# Patient Record
Sex: Female | Born: 1935 | Race: Black or African American | Hispanic: No | Marital: Married | Smoking: Former smoker
Health system: Southern US, Community
[De-identification: ages and names within clinical notes are randomized; demographics above are authoritative.]

## PROBLEM LIST (undated history)

## (undated) DIAGNOSIS — I4891 Unspecified atrial fibrillation: Secondary | ICD-10-CM

## (undated) DIAGNOSIS — R42 Dizziness and giddiness: Secondary | ICD-10-CM

## (undated) DIAGNOSIS — I10 Essential (primary) hypertension: Secondary | ICD-10-CM

## (undated) DIAGNOSIS — I4892 Unspecified atrial flutter: Secondary | ICD-10-CM

## (undated) HISTORY — PX: BREAST CYST ASPIRATION: SHX578

## (undated) HISTORY — PX: BREAST CYST EXCISION: SHX579

---

## 2009-01-20 HISTORY — PX: BREAST EXCISIONAL BIOPSY: SUR124

## 2016-03-27 ENCOUNTER — Inpatient Hospital Stay (HOSPITAL_COMMUNITY)
Admission: EM | Admit: 2016-03-27 | Discharge: 2016-04-01 | DRG: 243 | Disposition: A | Discharge status: Home / Self Care | Payer: Medicare HMO | Attending: Cardiovascular Disease | Admitting: Cardiovascular Disease

## 2016-03-27 ENCOUNTER — Emergency Department (HOSPITAL_COMMUNITY): Payer: Medicare HMO

## 2016-03-27 ENCOUNTER — Encounter (HOSPITAL_COMMUNITY): Payer: Self-pay

## 2016-03-27 DIAGNOSIS — E785 Hyperlipidemia, unspecified: Secondary | ICD-10-CM | POA: Diagnosis present

## 2016-03-27 DIAGNOSIS — R0789 Other chest pain: Secondary | ICD-10-CM | POA: Diagnosis present

## 2016-03-27 DIAGNOSIS — I480 Paroxysmal atrial fibrillation: Secondary | ICD-10-CM | POA: Diagnosis present

## 2016-03-27 DIAGNOSIS — Z8249 Family history of ischemic heart disease and other diseases of the circulatory system: Secondary | ICD-10-CM | POA: Diagnosis not present

## 2016-03-27 DIAGNOSIS — I10 Essential (primary) hypertension: Secondary | ICD-10-CM | POA: Diagnosis not present

## 2016-03-27 DIAGNOSIS — R400 Somnolence: Secondary | ICD-10-CM | POA: Diagnosis present

## 2016-03-27 DIAGNOSIS — I484 Atypical atrial flutter: Secondary | ICD-10-CM

## 2016-03-27 DIAGNOSIS — B952 Enterococcus as the cause of diseases classified elsewhere: Secondary | ICD-10-CM | POA: Diagnosis present

## 2016-03-27 DIAGNOSIS — Z7982 Long term (current) use of aspirin: Secondary | ICD-10-CM

## 2016-03-27 DIAGNOSIS — E876 Hypokalemia: Secondary | ICD-10-CM | POA: Diagnosis present

## 2016-03-27 DIAGNOSIS — R55 Syncope and collapse: Secondary | ICD-10-CM

## 2016-03-27 DIAGNOSIS — G4730 Sleep apnea, unspecified: Secondary | ICD-10-CM | POA: Diagnosis present

## 2016-03-27 DIAGNOSIS — Z955 Presence of coronary angioplasty implant and graft: Secondary | ICD-10-CM

## 2016-03-27 DIAGNOSIS — E782 Mixed hyperlipidemia: Secondary | ICD-10-CM | POA: Diagnosis not present

## 2016-03-27 DIAGNOSIS — I25118 Atherosclerotic heart disease of native coronary artery with other forms of angina pectoris: Secondary | ICD-10-CM | POA: Diagnosis present

## 2016-03-27 DIAGNOSIS — I25119 Atherosclerotic heart disease of native coronary artery with unspecified angina pectoris: Secondary | ICD-10-CM | POA: Diagnosis not present

## 2016-03-27 DIAGNOSIS — I495 Sick sinus syndrome: Secondary | ICD-10-CM | POA: Diagnosis present

## 2016-03-27 DIAGNOSIS — N390 Urinary tract infection, site not specified: Secondary | ICD-10-CM | POA: Diagnosis present

## 2016-03-27 DIAGNOSIS — R42 Dizziness and giddiness: Secondary | ICD-10-CM

## 2016-03-27 DIAGNOSIS — Z959 Presence of cardiac and vascular implant and graft, unspecified: Secondary | ICD-10-CM

## 2016-03-27 DIAGNOSIS — I248 Other forms of acute ischemic heart disease: Secondary | ICD-10-CM | POA: Diagnosis present

## 2016-03-27 DIAGNOSIS — R748 Abnormal levels of other serum enzymes: Secondary | ICD-10-CM | POA: Diagnosis not present

## 2016-03-27 DIAGNOSIS — Z79899 Other long term (current) drug therapy: Secondary | ICD-10-CM

## 2016-03-27 DIAGNOSIS — R600 Localized edema: Secondary | ICD-10-CM | POA: Diagnosis present

## 2016-03-27 DIAGNOSIS — I4891 Unspecified atrial fibrillation: Secondary | ICD-10-CM | POA: Diagnosis not present

## 2016-03-27 DIAGNOSIS — I119 Hypertensive heart disease without heart failure: Secondary | ICD-10-CM | POA: Diagnosis present

## 2016-03-27 DIAGNOSIS — I2510 Atherosclerotic heart disease of native coronary artery without angina pectoris: Secondary | ICD-10-CM | POA: Diagnosis not present

## 2016-03-27 DIAGNOSIS — I455 Other specified heart block: Secondary | ICD-10-CM

## 2016-03-27 HISTORY — DX: Essential (primary) hypertension: I10

## 2016-03-27 HISTORY — DX: Dizziness and giddiness: R42

## 2016-03-27 HISTORY — DX: Unspecified atrial flutter: I48.92

## 2016-03-27 HISTORY — DX: Unspecified atrial fibrillation: I48.91

## 2016-03-27 LAB — I-STAT CHEM 8, ED
BUN: 18 mg/dL (ref 6–20)
CHLORIDE: 103 mmol/L (ref 101–111)
Calcium, Ion: 1.28 mmol/L (ref 1.15–1.40)
Creatinine, Ser: 0.9 mg/dL (ref 0.44–1.00)
Glucose, Bld: 197 mg/dL — ABNORMAL HIGH (ref 65–99)
HEMATOCRIT: 43 % (ref 36.0–46.0)
Hemoglobin: 14.6 g/dL (ref 12.0–15.0)
POTASSIUM: 3.5 mmol/L (ref 3.5–5.1)
SODIUM: 141 mmol/L (ref 135–145)
TCO2: 32 mmol/L (ref 0–100)

## 2016-03-27 LAB — COMPREHENSIVE METABOLIC PANEL
ALBUMIN: 4 g/dL (ref 3.5–5.0)
ALK PHOS: 65 U/L (ref 38–126)
ALT: 17 U/L (ref 14–54)
AST: 21 U/L (ref 15–41)
Anion gap: 8 (ref 5–15)
BILIRUBIN TOTAL: 0.6 mg/dL (ref 0.3–1.2)
BUN: 14 mg/dL (ref 6–20)
CALCIUM: 10.3 mg/dL (ref 8.9–10.3)
CO2: 27 mmol/L (ref 22–32)
Chloride: 105 mmol/L (ref 101–111)
Creatinine, Ser: 1.06 mg/dL — ABNORMAL HIGH (ref 0.44–1.00)
GFR calc Af Amer: 57 mL/min — ABNORMAL LOW (ref >60)
GFR calc non Af Amer: 49 mL/min — ABNORMAL LOW (ref >60)
GLUCOSE: 199 mg/dL — AB (ref 65–99)
Potassium: 3.2 mmol/L — ABNORMAL LOW (ref 3.5–5.1)
Sodium: 140 mmol/L (ref 135–145)
TOTAL PROTEIN: 7.3 g/dL (ref 6.5–8.1)

## 2016-03-27 LAB — CBC WITH DIFFERENTIAL/PLATELET
BASOS PCT: 0 %
Basophils Absolute: 0 10*3/uL (ref 0.0–0.1)
EOS ABS: 0.1 10*3/uL (ref 0.0–0.7)
Eosinophils Relative: 1 %
HCT: 42.1 % (ref 36.0–46.0)
HEMOGLOBIN: 13.6 g/dL (ref 12.0–15.0)
LYMPHS ABS: 1.8 10*3/uL (ref 0.7–4.0)
Lymphocytes Relative: 19 %
MCH: 27.3 pg (ref 26.0–34.0)
MCHC: 32.3 g/dL (ref 30.0–36.0)
MCV: 84.4 fL (ref 78.0–100.0)
Monocytes Absolute: 0.8 10*3/uL (ref 0.1–1.0)
Monocytes Relative: 8 %
NEUTROS PCT: 72 %
Neutro Abs: 6.8 10*3/uL (ref 1.7–7.7)
Platelets: 169 10*3/uL (ref 150–400)
RBC: 4.99 MIL/uL (ref 3.87–5.11)
RDW: 13.3 % (ref 11.5–15.5)
WBC: 9.4 10*3/uL (ref 4.0–10.5)

## 2016-03-27 LAB — MAGNESIUM: MAGNESIUM: 2.2 mg/dL (ref 1.7–2.4)

## 2016-03-27 LAB — TROPONIN I: Troponin I: 0.22 ng/mL (ref <0.03)

## 2016-03-27 LAB — BRAIN NATRIURETIC PEPTIDE: B Natriuretic Peptide: 134.7 pg/mL — ABNORMAL HIGH (ref 0.0–100.0)

## 2016-03-27 LAB — I-STAT TROPONIN, ED: Troponin i, poc: 0.01 ng/mL (ref 0.00–0.08)

## 2016-03-27 LAB — I-STAT CG4 LACTIC ACID, ED: Lactic Acid, Venous: 1.78 mmol/L (ref 0.5–1.9)

## 2016-03-27 MED ORDER — HEPARIN BOLUS VIA INFUSION
4000.0000 [IU] | Freq: Once | INTRAVENOUS | Status: AC
  Administered 2016-03-27: 4000 [IU] via INTRAVENOUS
  Filled 2016-03-27: qty 4000

## 2016-03-27 MED ORDER — ACETAMINOPHEN 325 MG PO TABS: 650.0000 mg | ORAL_TABLET | ORAL | Status: DC | PRN

## 2016-03-27 MED ORDER — HEPARIN (PORCINE) IN NACL 100-0.45 UNIT/ML-% IJ SOLN
800.0000 [IU]/h | INTRAMUSCULAR | Status: AC
  Administered 2016-03-27: 950 [IU]/h via INTRAVENOUS
  Administered 2016-03-30: 800 [IU]/h via INTRAVENOUS
  Filled 2016-03-27 (×3): qty 250

## 2016-03-27 MED ORDER — ONDANSETRON HCL 4 MG/2ML IJ SOLN: 4.0000 mg | Freq: Four times a day (QID) | INTRAMUSCULAR | Status: DC | PRN

## 2016-03-27 NOTE — ED Provider Notes (Signed)
[facility]-EMERGENCY DEPT Provider Note   CSN: [id number] Arrival date & time: 03/27/16  1617     History   Chief Complaint Chief Complaint  Patient presents with  . Chest Pain    HPI Janet Everett is a 81 y.o. female.  HPI   81 year old female with history of atrial fibrillation, atrial flutter, coronary artery disease, reportedly on calcium channel blocker but no blood thinners who presents with chest pain and shortness of breath. The patient states that at approximately 3:00 this afternoon, she had acute onset of chest pressure with sensation that she was unable to catch her breath. She also felt like she was going to pass out. EMS was called and they found her in atrial flutter with rate of 140-160. She was given diltiazem which decreased her rate to 82-100 and blood pressure increased to 142/84. She states she continues to feel a dull, aching substernal chest pressure with associated shortness of breath. She denies any recent medication changes. Denies any fevers or chills. Denies any specific alleviating or aggravating factors.  Past Medical History:  Diagnosis Date  . Atrial fibrillation and flutter [facility])     Patient Active Problem List   Diagnosis Date Noted  . Tachy-brady syndrome (HCC) 03/27/2016    History reviewed. No pertinent surgical history.  OB History    No data available       Home Medications    Prior to Admission medications   Medication Sig Start Date End Date Taking? Authorizing Provider  amLODipine (NORVASC) 5 MG tablet Take 5 mg by mouth at bedtime.   Yes Historical Provider, MD  aspirin EC 81 MG tablet Take 81 mg by mouth daily.   Yes Historical Provider, MD  magnesium hydroxide (MILK OF MAGNESIA) 400 MG/5ML suspension Take 30 mLs by mouth daily as needed for mild constipation.   Yes Historical Provider, MD  Multiple Vitamin (MULTIVITAMIN WITH MINERALS) TABS tablet Take 1 tablet by mouth daily.   Yes Historical Provider, MD  nitroGLYCERIN (NITROSTAT)  0.4 MG SL tablet Place 0.4 mg under the tongue every 5 (five) minutes as needed for chest pain.   Yes Historical Provider, MD  ALPRAZolam Prudy Feeler) 0.5 MG tablet Take 0.5 mg by mouth at bedtime as needed for anxiety.    Historical Provider, MD  atorvastatin (LIPITOR) 40 MG tablet Take 40 mg by mouth daily.    Historical Provider, MD  hydrochlorothiazide (HYDRODIURIL) 25 MG tablet Take 25 mg by mouth daily.    Historical Provider, MD  NIFEdipine (PROCARDIA XL/ADALAT-CC) 60 MG 24 hr tablet Take 60 mg by mouth daily as needed (for tachycardia).    Historical Provider, MD  triamterene-hydrochlorothiazide (MAXZIDE-25) 37.5-25 MG tablet Take 1 tablet by mouth daily.    Historical Provider, MD    Family History Family History  Problem Relation Age of Onset  . Hypertension Mother     Social History Social History  Substance Use Topics  . Smoking status: Not on file  . Smokeless tobacco: Not on file  . Alcohol use Not on file     Allergies   Patient has no known allergies.   Review of Systems Review of Systems  Constitutional: Positive for fatigue. Negative for chills and fever.  HENT: Negative for congestion, rhinorrhea and sore throat.   Eyes: Negative for visual disturbance.  Respiratory: Positive for chest tightness and shortness of breath. Negative for cough and wheezing.   Cardiovascular: Positive for chest pain. Negative for leg swelling.  Gastrointestinal: Negative for abdominal pain, diarrhea,  nausea and vomiting.  Genitourinary: Negative for dysuria, flank pain, vaginal bleeding and vaginal discharge.  Musculoskeletal: Negative for neck pain.  Skin: Negative for rash.  Allergic/Immunologic: Negative for immunocompromised state.  Neurological: Positive for weakness and light-headedness. Negative for syncope and headaches.  Hematological: Does not bruise/bleed easily.  All other systems reviewed and are negative.    Physical Exam Updated Vital Signs BP (!) 174/101 (BP  Location: Right Arm)   Pulse (!) 49   Temp 97.7 F (36.5 C) (Oral)   Resp 14   Ht 5\' 2"  (1.575 m)   Wt 152 lb (68.9 kg)   SpO2 100%   BMI 27.80 kg/m   Physical Exam  Constitutional: She is oriented to person, place, and time. She appears well-developed and well-nourished. She appears distressed.  HENT:  Head: Normocephalic and atraumatic.  Eyes: Conjunctivae are normal.  Neck: Neck supple.  Cardiovascular: Exam reveals no friction rub.   No murmur heard. Irregularly irregular rhythm, primarily with tachycardia but intermittently with pauses lasting up to 5 seconds.  Pulmonary/Chest: Effort normal and breath sounds normal. No respiratory distress. She has no wheezes. She has no rales.  Abdominal: She exhibits no distension.  Musculoskeletal: She exhibits no edema.  Neurological: She is alert and oriented to person, place, and time. She exhibits normal muscle tone.  Skin: Skin is warm. Capillary refill takes less than 2 seconds.  Psychiatric: She has a normal mood and affect.  Nursing note and vitals reviewed.    ED Treatments / Results  Labs (all labs ordered are listed, but only abnormal results are displayed) Labs Reviewed  MRSA PCR SCREENING - Abnormal; Notable for the following:       Result Value   MRSA by PCR INVALID RESULTS, SPECIMEN SENT FOR CULTURE (*)    All other components within normal limits  COMPREHENSIVE METABOLIC PANEL - Abnormal; Notable for the following:    Potassium 3.2 (*)    Glucose, Bld 199 (*)    Creatinine, Ser 1.06 (*)    GFR calc non Af Amer 49 (*)    GFR calc Af Amer 57 (*)    All other components within normal limits  BRAIN NATRIURETIC PEPTIDE - Abnormal; Notable for the following:    B Natriuretic Peptide 134.7 (*)    All other components within normal limits  TROPONIN I - Abnormal; Notable for the following:    Troponin I 0.22 (*)    All other components within normal limits  TROPONIN I - Abnormal; Notable for the following:     Troponin I 0.25 (*)    All other components within normal limits  BASIC METABOLIC PANEL - Abnormal; Notable for the following:    Potassium 3.1 (*)    GFR calc non Af Amer 59 (*)    All other components within normal limits  HEPARIN LEVEL (UNFRACTIONATED) - Abnormal; Notable for the following:    Heparin Unfractionated 0.85 (*)    All other components within normal limits  I-STAT CHEM 8, ED - Abnormal; Notable for the following:    Glucose, Bld 197 (*)    All other components within normal limits  MRSA CULTURE  URINE CULTURE  CBC WITH DIFFERENTIAL/PLATELET  MAGNESIUM  CBC  PROTIME-INR  TROPONIN I  HEPARIN LEVEL (UNFRACTIONATED)  URINALYSIS, ROUTINE W REFLEX MICROSCOPIC  LIPID PANEL  I-STAT TROPOININ, ED  I-STAT CG4 LACTIC ACID, ED  Rosezena SensorI-STAT TROPOININ, ED    EKG  EKG Interpretation  Date/Time:  Thursday March 27 2016 16:17:58  EST Ventricular Rate:  96 PR Interval:    QRS Duration: 83 QT Interval:  383 QTC Calculation: 484 R Axis:   16 Text Interpretation:  Atrial flutter with predominant 3:1 AV block Abnormal R-wave progression, early transition LVH with secondary repolarization abnormality No old tracing to compare Confirmed by Ellionna Buckbee MD, Sheria Lang [phone]) on 03/27/2016 4:21:41 PM Also confirmed by Erma Heritage MD, Harden Bramer [phone]), editor [place]  CCT, [place] ([id number])  on 03/28/2016 7:25:39 AM       Radiology Dg Chest Portable 1 View  Result Date: 03/27/2016 CLINICAL DATA:  A-fib with SOB today. Hx of HTN. EXAM: PORTABLE CHEST - 1 VIEW COMPARISON:  none FINDINGS: Monitoring devices overlie the patient. Relatively low lung volumes with no focal infiltrate or overt edema. Heart size upper limits normal for technique. No effusion.  No pneumothorax. Right shoulder arthroplasty. Degenerative change in the left shoulder. IMPRESSION: No acute cardiopulmonary disease. Electronically Signed   By: Corlis Leak M.D.   On: 03/27/2016 16:53    Procedures .Critical Care Performed by: Shaune Pollack Authorized by: Shaune Pollack   Critical care provider statement:    Critical care time (minutes):  55   Critical care time was exclusive of:  Separately billable procedures and treating other patients   Critical care was necessary to treat or prevent imminent or life-threatening deterioration of the following conditions:  Cardiac failure and circulatory failure   Critical care was time spent personally by me on the following activities:  Development of treatment plan with patient or surrogate, discussions with consultants, evaluation of patient's response to treatment, examination of patient, obtaining history from patient or surrogate, ordering and performing treatments and interventions, ordering and review of laboratory studies, ordering and review of radiographic studies, pulse oximetry, re-evaluation of patient's condition and review of old charts   I assumed direction of critical care for this patient from another provider in my specialty: no     (including critical care time)  Medications Ordered in ED Medications  acetaminophen (TYLENOL) tablet 650 mg (not administered)  ondansetron (ZOFRAN) injection 4 mg (not administered)  heparin ADULT infusion 100 units/mL (25000 units/222mL sodium chloride 0.45%) (800 Units/hr Intravenous Rate/Dose Change 03/28/16 0532)  fluconazole (DIFLUCAN) tablet 150 mg (not administered)  amLODipine (NORVASC) tablet 5 mg (not administered)  spironolactone (ALDACTONE) tablet 25 mg (not administered)  atorvastatin (LIPITOR) tablet 40 mg (not administered)  heparin bolus via infusion 4,000 Units (4,000 Units Intravenous Bolus from Bag 03/27/16 2312)     Initial Impression / Assessment and Plan / ED Course  I have reviewed the triage vital signs and the nursing notes.  Pertinent labs & imaging results that were available during my care of the patient were reviewed by me and considered in my medical decision making (see chart for details).      81 year old female with history of coronary artery disease and atrial flutter who presents in acute atrial flutter, now complicated by prolonged pauses up to 4-5 seconds. She has received diltiazem and given her sinus pauses, and hesitant to give additional beta-blockade. She does have diffuse evidence of ischemia as well, though no overt ST elevations. Given her complicated arrhythmia, will consult cardiology for recommendations. Otherwise, will check screening labs and reassess.  Cardiology has evaluated. Pt has complex arrhythmia and will be admitted to CCU for PM placement followed by rhythm control. Her labs are o/w largely unremarkable. Lytes acceptable. She continues to have pauses but is able to regain perfusion thereafter. Pt on pads and monitored  closely in ED. Admit to CCU.  Final Clinical Impressions(s) / ED Diagnoses   Final diagnoses:  Atypical atrial flutter (HCC)  Sinus pause  Near syncope    New Prescriptions Current Discharge Medication List       Shaune Pollack, MD 03/28/16 1050

## 2016-03-27 NOTE — ED Triage Notes (Signed)
Patient transported via [facility]. EMS reports the patient had an onset of chest pain while at a restaurant at approx 15:00. She took 2 of her own nitro without relief. Upon arrival EMS found her diaphoretic with a BP of 106/70. Her initial rhythm was A-Fib and EMS administered 10 of Cardizem. Her rate increased to 140-160 bpm and they administered another 10mg  of Cardizem. Her pulse decreased to 80-100bpm and her BP increased to 142/84.

## 2016-03-27 NOTE — Consult Note (Signed)
CONSULT NOTE  Date: 03/27/2016               Patient Name:  Janet Everett MRN: [id number]  DOB: [birth year] Age / Sex: 81 y.o., female        PCP: No PCP Per Patient Primary Cardiologist: Ellis Parents Steffon Gladu            Referring Physician: Erma Heritage              Reason for Consult: Tachy brady syndrom            History of Present Illness: Patient is a 81 y.o. female with a PMHx of Coronary artery disease, HTN And paroxysmal A. fib, who was admitted to [facility] on 03/27/2016 for evaluation of episodes of near syncope.  Patient moved from [place] in October. She knows that she has a history of coronary artery disease and has had 2 stents. She does not know any of her medications. She does not think that she is on a blood thinner. She has her the name Pradaxa but that is all she can tell me.  She also thinks that she is on amlodipine for her high blood pressure.  She's had episodes of palpitations for the past month or so. For the past several weeks she's had intermittent episodes of lightheadedness and dizziness. These episodes occur suddenly.  They're not associated with any exertion.  She also has been having some episodes of chest tightness and burning in her chest. These are similar to her presentation when she had the 2 stents placed..   She denies any cough or sputum production. She denies any fever. She denies any blood in her stool. She denies any easy bruising.   Medications: Outpatient medications:  (Not in a hospital admission)  Current medications: No current facility-administered medications for this encounter.    No current outpatient prescriptions on file.     Allergies not on file   Past Medical History:  Diagnosis Date  . Atrial fibrillation and flutter (HCC)     History reviewed. No pertinent surgical history.  Family History  Problem Relation Age of Onset  . Hypertension Mother     Social History:  has no tobacco, alcohol, and drug history on  file.   Review of Systems: Constitutional:  denies fever, chills, diaphoresis, appetite change and fatigue.  HEENT: denies photophobia, eye pain, redness, hearing loss, ear pain, congestion, sore throat, rhinorrhea, sneezing, neck pain, neck stiffness and tinnitus.  Respiratory: denies SOB, DOE, cough, chest tightness, and wheezing.  Cardiovascular: admits to chest pain,  Gastrointestinal: denies nausea, vomiting, abdominal pain, diarrhea, constipation, blood in stool.  Genitourinary: denies dysuria, urgency, frequency, hematuria, flank pain and difficulty urinating.  Musculoskeletal: denies  myalgias, back pain, joint swelling, arthralgias and gait problem.   Skin: denies pallor, rash and wound.  Neurological: admits to dizziness,  syncope, weakness, light-headedness, numbness    Hematological: denies adenopathy, easy bruising, personal or family bleeding history.  Psychiatric/ Behavioral: denies suicidal ideation, mood changes, confusion, nervousness, sleep disturbance and agitation.    Physical Exam: BP 147/88   Pulse (!) 131   Resp 20   Wt 155 lb (70.3 kg)   SpO2 100%   Wt Readings from Last 3 Encounters:  03/27/16 155 lb (70.3 kg)    General: Vital signs reviewed and noted. Chronically ill-appearing elderly female   Head: Normocephalic, atraumatic, sclera anicteric,   Neck: Supple. Negative for carotid bruits. No JVD   Lungs:  Clear bilaterally,  no  wheezes, rales, or rhonchi. Breathing is normal   Heart: irreg .  Irreg.   with S1 S2. No murmurs, rubs, or gallops   Abdomen/ GI :  Soft, non-tender, non-distended with normoactive bowel sounds. No hepatomegaly. No rebound/guarding. No obvious abdominal masses   MSK: Strength and the appear normal for age.   Extremities: No clubbing or cyanosis. No edema.  Distal pedal pulses are 2+ and equal   Neurologic:  CN are grossly intact,  No obvious motor or sensory defect.  Alert and oriented X 3. Moves all extremities spontaneously.   Psych: Responds to questions appropriately with a normal affect.     Lab results: Basic Metabolic Panel:  Recent Labs Lab 03/27/16 1640 03/27/16 1650  NA 140 141  K 3.2* 3.5  CL 105 103  CO2 27  --   GLUCOSE 199* 197*  BUN 14 18  CREATININE 1.06* 0.90  CALCIUM 10.3  --   MG 2.2  --     Liver Function Tests:  Recent Labs Lab 03/27/16 1640  AST 21  ALT 17  ALKPHOS 65  BILITOT 0.6  PROT 7.3  ALBUMIN 4.0   No results for input(s): LIPASE, AMYLASE in the last 168 hours. No results for input(s): AMMONIA in the last 168 hours.  CBC:  Recent Labs Lab 03/27/16 1640 03/27/16 1650  WBC 9.4  --   NEUTROABS 6.8  --   HGB 13.6 14.6  HCT 42.1 43.0  MCV 84.4  --   PLT 169  --     Cardiac Enzymes: No results for input(s): CKTOTAL, CKMB, CKMBINDEX, TROPONINI in the last 168 hours.  BNP: Invalid input(s): POCBNP  CBG: No results for input(s): GLUCAP in the last 168 hours.  Coagulation Studies: No results for input(s): LABPROT, INR in the last 72 hours.   Other results:  Personal review of EKG shows :  intermittant atrial fib at rate of  96 Personal review of telemetry reveals rapid atrial fibrillation followed by pauses of 3-4 seconds and normal sinus rhythm. These appear to be post-termination pauses.  Imaging: Dg Chest Portable 1 View  Result Date: 03/27/2016 CLINICAL DATA:  A-fib with SOB today. Hx of HTN. EXAM: PORTABLE CHEST - 1 VIEW COMPARISON:  none FINDINGS: Monitoring devices overlie the patient. Relatively low lung volumes with no focal infiltrate or overt edema. Heart size upper limits normal for technique. No effusion.  No pneumothorax. Right shoulder arthroplasty. Degenerative change in the left shoulder. IMPRESSION: No acute cardiopulmonary disease. Electronically Signed   By: Corlis Leak M.D.   On: 03/27/2016 16:53      Assessment & Plan:  1. Near-syncope: These appear to be due to her tachybradycardia syndrome with significant pauses. She  does not know any of her medications. She does know that she has atrial fibrillation but she does not think that she is on a blood thinner. Her family will go get her medicine bottles tonight.  I named all of the standard anticoagulation medications and she did not recognize any of them except for Pradaxa and she knows that she is not on Pradaxa.  We will place her on IV heparin for tonight.   2. Tachybradycardia syndrome: She clearly has episodes of tachybradycardia syndrome. She has rapid atrial fibrillation followed by 3-4 second pauses and the sensation of near syncope. I suspect that she will need a pacemaker placed. She has never been told this before. We will place the external temporary pacer at the bedside. I do  not think that she'll need a temporary pacing wire tonight. Her rate is a little bit fast but it will not be possible to add any AV nodal blocking  agents tonight because of the intermittent pauses. She's been in this rhythm for probably 3 weeks according to her history.   She's not having perfusion issues. Her creatinine remains normal.  Will need an echocardiogram once we get her heart rate slowed. I do not think that an echocardiogram when she is in rapid atrial fibrillation will necessarily give us the best indication of her left ventricle systolic function  3. Coronary artery disease: She has a history of coronary stenting. Her history is somewhat vague but it sounds like she might be having some episodes of her angina or angina equivalent. We'll draw troponins. Unfortunately, it is not possible to slow her heart rate down with AV nodal agents because of her episodes of significant pauses.  She may ultimately need a  left heart cath.      Vesta MixerPhilip J. Imelda Dandridge, Montez HagemanJr., MD, [facility] 03/27/2016, 6:05 PM Office - [phone] Pager [phone]- [phone]

## 2016-03-27 NOTE — ED Notes (Signed)
EDP notified of patient's variable heart rate.  Rate is currently fluctuating between 60-140 bpm. Patient has had several pauses.

## 2016-03-27 NOTE — Progress Notes (Signed)
ANTICOAGULATION CONSULT NOTE - Initial Consult  Pharmacy Consult for Heparin  Indication: atrial fibrillation  No Known Allergies  Patient Measurements: Height: 5\' 2"  (157.5 cm) Weight: 152 lb (68.9 kg) IBW/kg (Calculated) : 50.1 Heparin Dosing Weight: 68.9 kg  Vital Signs: Temp: 98 F (36.7 C) (03/08 2134) BP: 165/70 (03/08 2015) Pulse Rate: 54 (03/08 2134)  Labs:  Recent Labs  03/27/16 1640 03/27/16 1650  HGB 13.6 14.6  HCT 42.1 43.0  PLT 169  --   CREATININE 1.06* 0.90    Estimated Creatinine Clearance: 46.8 mL/min (by C-G formula based on SCr of 0.9 mg/dL).   Medical History: Past Medical History:  Diagnosis Date  . Atrial fibrillation and flutter [facility](HCC)    Assessment:   81 yr old female admitted with near syncope and tachy-brady syndrome. May need pacemaker.   To begin IV heparin for atrial fibrillation. Hx PAF.  On Aspirin 81 mg daily prior to admission, but not on anticoagulant.  Goal of Therapy:  Heparin level 0.3-0.7 units/ml Monitor platelets by anticoagulation protocol: Yes   Plan:   Heparin 4000 units IV x 1  Heparin drip to begin at 950 units/hr.  Heparin level ~6 hrs after drip begins.  Daily heparin level and CBC while on heparin.  Dennie FettersEgan, Skylinn Vialpando Donovan, RPh Pager: [phone] 03/27/2016,10:46 PM

## 2016-03-27 NOTE — ED Notes (Signed)
Patient is diaphoretic and reports "feeling strange". Patient placed on 3L O2 due to HR and distress.

## 2016-03-27 NOTE — H&P (Signed)
Admission Note   Date: 03/27/2016               Patient Name:  Janet Everett MRN: [id number]  DOB: Jun 17, 1935 Age / Sex: 81 y.o., female        PCP: No PCP Per Patient Primary Cardiologist: Ellis Parents Nahser            Referring Physician: Erma Heritage              Reason for Consult: Tachy brady syndrom            History of Present Illness: Patient is a 81 y.o. female with a PMHx of Coronary artery disease, HTN And paroxysmal A. fib, who was admitted to [facility] on 03/27/2016 for evaluation of episodes of near syncope.  Patient moved from [place] in October. She knows that she has a history of coronary artery disease and has had 2 stents. She does not know any of her medications. She does not think that she is on a blood thinner. She has her the name Pradaxa but that is all she can tell me.  She also thinks that she is on amlodipine for her high blood pressure.  She's had episodes of palpitations for the past month or so. For the past several weeks she's had intermittent episodes of lightheadedness and dizziness. These episodes occur suddenly.  They're not associated with any exertion.  She also has been having some episodes of chest tightness and burning in her chest. These are similar to her presentation when she had the 2 stents placed..   She denies any cough or sputum production. She denies any fever. She denies any blood in her stool. She denies any easy bruising.   Medications: Outpatient medications:  (Not in a hospital admission)  Current medications: No current facility-administered medications for this encounter.    No current outpatient prescriptions on file.     Allergies not on file   Past Medical History:  Diagnosis Date  . Atrial fibrillation and flutter (HCC)     History reviewed. No pertinent surgical history.  Family History  Problem Relation Age of Onset  . Hypertension Mother     Social History:  has no tobacco, alcohol, and drug history on  file.   Review of Systems: Constitutional:  denies fever, chills, diaphoresis, appetite change and fatigue.  HEENT: denies photophobia, eye pain, redness, hearing loss, ear pain, congestion, sore throat, rhinorrhea, sneezing, neck pain, neck stiffness and tinnitus.  Respiratory: denies SOB, DOE, cough, chest tightness, and wheezing.  Cardiovascular: admits to chest pain,  Gastrointestinal: denies nausea, vomiting, abdominal pain, diarrhea, constipation, blood in stool.  Genitourinary: denies dysuria, urgency, frequency, hematuria, flank pain and difficulty urinating.  Musculoskeletal: denies  myalgias, back pain, joint swelling, arthralgias and gait problem.   Skin: denies pallor, rash and wound.  Neurological: admits to dizziness,  syncope, weakness, light-headedness, numbness    Hematological: denies adenopathy, easy bruising, personal or family bleeding history.  Psychiatric/ Behavioral: denies suicidal ideation, mood changes, confusion, nervousness, sleep disturbance and agitation.    Physical Exam: BP 147/88   Pulse (!) 131   Resp 20   Wt 155 lb (70.3 kg)   SpO2 100%   Wt Readings from Last 3 Encounters:  03/27/16 155 lb (70.3 kg)    General: Vital signs reviewed and noted. Chronically ill-appearing elderly female   Head: Normocephalic, atraumatic, sclera anicteric,   Neck: Supple. Negative for carotid bruits. No JVD   Lungs:  Clear  bilaterally, no  wheezes, rales, or rhonchi. Breathing is normal   Heart: irreg .  Irreg.   with S1 S2. No murmurs, rubs, or gallops   Abdomen/ GI :  Soft, non-tender, non-distended with normoactive bowel sounds. No hepatomegaly. No rebound/guarding. No obvious abdominal masses   MSK: Strength and the appear normal for age.   Extremities: No clubbing or cyanosis. No edema.  Distal pedal pulses are 2+ and equal   Neurologic:  CN are grossly intact,  No obvious motor or sensory defect.  Alert and oriented X 3. Moves all extremities spontaneously.   Psych: Responds to questions appropriately with a normal affect.     Lab results: Basic Metabolic Panel:  Recent Labs Lab 03/27/16 1640 03/27/16 1650  NA 140 141  K 3.2* 3.5  CL 105 103  CO2 27  --   GLUCOSE 199* 197*  BUN 14 18  CREATININE 1.06* 0.90  CALCIUM 10.3  --   MG 2.2  --     Liver Function Tests:  Recent Labs Lab 03/27/16 1640  AST 21  ALT 17  ALKPHOS 65  BILITOT 0.6  PROT 7.3  ALBUMIN 4.0   No results for input(s): LIPASE, AMYLASE in the last 168 hours. No results for input(s): AMMONIA in the last 168 hours.  CBC:  Recent Labs Lab 03/27/16 1640 03/27/16 1650  WBC 9.4  --   NEUTROABS 6.8  --   HGB 13.6 14.6  HCT 42.1 43.0  MCV 84.4  --   PLT 169  --     Cardiac Enzymes: No results for input(s): CKTOTAL, CKMB, CKMBINDEX, TROPONINI in the last 168 hours.  BNP: Invalid input(s): POCBNP  CBG: No results for input(s): GLUCAP in the last 168 hours.  Coagulation Studies: No results for input(s): LABPROT, INR in the last 72 hours.   Other results:  Personal review of EKG shows :  intermittant atrial fib at rate of  96 Personal review of telemetry reveals rapid atrial fibrillation followed by pauses of 3-4 seconds and normal sinus rhythm. These appear to be post-termination pauses.  Imaging: Dg Chest Portable 1 View  Result Date: 03/27/2016 CLINICAL DATA:  A-fib with SOB today. Hx of HTN. EXAM: PORTABLE CHEST - 1 VIEW COMPARISON:  none FINDINGS: Monitoring devices overlie the patient. Relatively low lung volumes with no focal infiltrate or overt edema. Heart size upper limits normal for technique. No effusion.  No pneumothorax. Right shoulder arthroplasty. Degenerative change in the left shoulder. IMPRESSION: No acute cardiopulmonary disease. Electronically Signed   By: Corlis Leak  Hassell M.D.   On: 03/27/2016 16:53      Assessment & Plan:  1. Near-syncope: These appear to be due to her tachybradycardia syndrome with significant pauses. She  does not know any of her medications. She does know that she has atrial fibrillation but she does not think that she is on a blood thinner. Her family will go get her medicine bottles tonight.  I named all of the standard anticoagulation medications and she did not recognize any of them except for Pradaxa and she knows that she is not on Pradaxa.  We will place her on IV heparin for tonight.   2. Tachybradycardia syndrome: She clearly has episodes of tachybradycardia syndrome. She has rapid atrial fibrillation followed by 3-4 second pauses and the sensation of near syncope. I suspect that she will need a pacemaker placed. She has never been told this before. We will place the external temporary pacer at the bedside. I  do not think that she'll need a temporary pacing wire tonight. Her rate is a little bit fast but it will not be possible to add any AV nodal blocking  agents tonight because of the intermittent pauses. She's been in this rhythm for probably 3 weeks according to her history.   She's not having perfusion issues. Her creatinine remains normal.  Will need an echocardiogram once we get her heart rate slowed. I do not think that an echocardiogram when she is in rapid atrial fibrillation will necessarily give [place] the best indication of her left ventricle systolic function  3. Coronary artery disease: She has a history of coronary stenting. Her history is somewhat vague but it sounds like she might be having some episodes of her angina or angina equivalent. We'll draw troponins. Unfortunately, it is not possible to slow her heart rate down with AV nodal agents because of her episodes of significant pauses.  She may ultimately need a  left heart cath.      Vesta Mixer, Montez Hageman., MD, [facility] 03/27/2016, 6:05 PM Office - [phone] Pager [phone]

## 2016-03-28 ENCOUNTER — Encounter (HOSPITAL_COMMUNITY): Payer: Self-pay

## 2016-03-28 DIAGNOSIS — I10 Essential (primary) hypertension: Secondary | ICD-10-CM

## 2016-03-28 DIAGNOSIS — I2510 Atherosclerotic heart disease of native coronary artery without angina pectoris: Secondary | ICD-10-CM

## 2016-03-28 DIAGNOSIS — E782 Mixed hyperlipidemia: Secondary | ICD-10-CM

## 2016-03-28 LAB — BASIC METABOLIC PANEL
ANION GAP: 8 (ref 5–15)
BUN: 12 mg/dL (ref 6–20)
CO2: 25 mmol/L (ref 22–32)
Calcium: 9.8 mg/dL (ref 8.9–10.3)
Chloride: 108 mmol/L (ref 101–111)
Creatinine, Ser: 0.91 mg/dL (ref 0.44–1.00)
GFR, EST NON AFRICAN AMERICAN: 59 mL/min — AB (ref >60)
Glucose, Bld: 92 mg/dL (ref 65–99)
Potassium: 3.1 mmol/L — ABNORMAL LOW (ref 3.5–5.1)
SODIUM: 141 mmol/L (ref 135–145)

## 2016-03-28 LAB — URINALYSIS, ROUTINE W REFLEX MICROSCOPIC
Bilirubin Urine: NEGATIVE
GLUCOSE, UA: NEGATIVE mg/dL
HGB URINE DIPSTICK: NEGATIVE
Ketones, ur: NEGATIVE mg/dL
NITRITE: NEGATIVE
Protein, ur: NEGATIVE mg/dL
SPECIFIC GRAVITY, URINE: 1.014 (ref 1.005–1.030)
pH: 7 (ref 5.0–8.0)

## 2016-03-28 LAB — PROTIME-INR
INR: 1.13
Prothrombin Time: 14.6 seconds (ref 11.4–15.2)

## 2016-03-28 LAB — CBC
HCT: 38.3 % (ref 36.0–46.0)
Hemoglobin: 12.5 g/dL (ref 12.0–15.0)
MCH: 27.4 pg (ref 26.0–34.0)
MCHC: 32.6 g/dL (ref 30.0–36.0)
MCV: 84 fL (ref 78.0–100.0)
Platelets: 161 10*3/uL (ref 150–400)
RBC: 4.56 MIL/uL (ref 3.87–5.11)
RDW: 13.3 % (ref 11.5–15.5)
WBC: 7 10*3/uL (ref 4.0–10.5)

## 2016-03-28 LAB — LIPID PANEL
CHOL/HDL RATIO: 4 ratio
CHOLESTEROL: 231 mg/dL — AB (ref 0–200)
HDL: 58 mg/dL (ref >40)
LDL CALC: 162 mg/dL — AB (ref 0–99)
Triglycerides: 54 mg/dL (ref <150)
VLDL: 11 mg/dL (ref 0–40)

## 2016-03-28 LAB — MRSA PCR SCREENING: MRSA by PCR: INVALID — AB

## 2016-03-28 LAB — TROPONIN I
TROPONIN I: 0.12 ng/mL — AB (ref <0.03)
Troponin I: 0.25 ng/mL (ref <0.03)

## 2016-03-28 LAB — HEPARIN LEVEL (UNFRACTIONATED)
HEPARIN UNFRACTIONATED: 0.85 [IU]/mL — AB (ref 0.30–0.70)
HEPARIN UNFRACTIONATED: 0.43 [IU]/mL (ref 0.30–0.70)
Heparin Unfractionated: 0.53 IU/mL (ref 0.30–0.70)

## 2016-03-28 MED ORDER — SENNA 8.6 MG PO TABS
1.0000 | ORAL_TABLET | Freq: Every day | ORAL | Status: DC | PRN
  Administered 2016-03-28: 8.6 mg via ORAL
  Filled 2016-03-28: qty 1

## 2016-03-28 MED ORDER — ATORVASTATIN CALCIUM 40 MG PO TABS
40.0000 mg | ORAL_TABLET | Freq: Every day | ORAL | Status: DC
  Administered 2016-03-28 – 2016-03-31 (×4): 40 mg via ORAL
  Filled 2016-03-28 (×4): qty 1

## 2016-03-28 MED ORDER — SPIRONOLACTONE 25 MG PO TABS
25.0000 mg | ORAL_TABLET | Freq: Every day | ORAL | Status: DC
  Administered 2016-03-28 – 2016-04-01 (×5): 25 mg via ORAL
  Filled 2016-03-28 (×5): qty 1

## 2016-03-28 MED ORDER — AMLODIPINE BESYLATE 5 MG PO TABS
5.0000 mg | ORAL_TABLET | Freq: Every day | ORAL | Status: DC
  Administered 2016-03-28 – 2016-03-30 (×3): 5 mg via ORAL
  Filled 2016-03-28 (×3): qty 1

## 2016-03-28 MED ORDER — FLUCONAZOLE 150 MG PO TABS
150.0000 mg | ORAL_TABLET | Freq: Once | ORAL | Status: AC
  Administered 2016-03-28: 150 mg via ORAL
  Filled 2016-03-28: qty 1

## 2016-03-28 NOTE — Progress Notes (Signed)
Progress Note  Patient Name: Janet Everett Date of Encounter: 03/28/2016  Primary Cardiologist: new, Nahser  Subjective   81 yo female, hx of HTN,  Presented on 3/8 with pre-syncope Was found to have tachy- brady with rapid AF in the 120-140 followed by pauses of 3 seconds and sinus bradycardia for the next 30 seconds or so  Feeling much better  Rhythm has calmed down significantly .   Inpatient Medications    Scheduled Meds:  Continuous Infusions: . heparin 800 Units/hr (03/28/16 0532)   PRN Meds: acetaminophen, ondansetron (ZOFRAN) IV   Vital Signs    Vitals:   03/28/16 0556 03/28/16 0700 03/28/16 0800 03/28/16 0832  BP: (!) 167/96 (!) 189/86 (!) 174/101   Pulse:  (!) 50 (!) 49   Resp:  16 14   Temp:    97.7 F (36.5 C)  TempSrc:    Oral  SpO2: 98% 100% 100%   Weight:      Height:        Intake/Output Summary (Last 24 hours) at 03/28/16 1014 Last data filed at 03/28/16 0800  Gross per 24 hour  Intake            80.06 ml  Output              500 ml  Net          -419.94 ml   Filed Weights   03/27/16 1638 03/27/16 2134  Weight: 155 lb (70.3 kg) 152 lb (68.9 kg)    Telemetry    Sinus brady , NSR  - Personally Reviewed  ECG       Physical Exam   BP (!) 174/101 (BP Location: Right Arm)   Pulse (!) 49   Temp 97.7 F (36.5 C) (Oral)   Resp 14   Ht 5\' 2"  (1.575 m)   Wt 152 lb (68.9 kg)   SpO2 100%   BMI 27.80 kg/m   GEN: No acute distress.   Neck: No JVD Cardiac: RRR, no murmurs, rubs, or gallops.  Respiratory: Clear to auscultation bilaterally. GI: Soft, nontender, non-distended  MS: No edema; No deformity. Neuro:  Nonfocal  Psych: Normal affect   Labs    Chemistry Recent Labs Lab 03/27/16 1640 03/27/16 1650 03/28/16 0425  NA 140 141 141  K 3.2* 3.5 3.1*  CL 105 103 108  CO2 27  --  25  GLUCOSE 199* 197* 92  BUN 14 18 12   CREATININE 1.06* 0.90 0.91  CALCIUM 10.3  --  9.8  PROT 7.3  --   --   ALBUMIN 4.0  --   --   AST  21  --   --   ALT 17  --   --   ALKPHOS 65  --   --   BILITOT 0.6  --   --   GFRNONAA 49*  --  59*  GFRAA 57*  --  >60  ANIONGAP 8  --  8     Hematology Recent Labs Lab 03/27/16 1640 03/27/16 1650 03/28/16 0425  WBC 9.4  --  7.0  RBC 4.99  --  4.56  HGB 13.6 14.6 12.5  HCT 42.1 43.0 38.3  MCV 84.4  --  84.0  MCH 27.3  --  27.4  MCHC 32.3  --  32.6  RDW 13.3  --  13.3  PLT 169  --  161    Cardiac Enzymes Recent Labs Lab 03/27/16 2240 03/28/16 0425  TROPONINI 0.22* 0.25*  Recent Labs Lab 03/27/16 1648  TROPIPOC 0.01     BNP Recent Labs Lab 03/27/16 1624  BNP 134.7*     DDimer No results for input(s): DDIMER in the last 168 hours.   Radiology    Dg Chest Portable 1 View  Result Date: 03/27/2016 CLINICAL DATA:  A-fib with SOB today. Hx of HTN. EXAM: PORTABLE CHEST - 1 VIEW COMPARISON:  none FINDINGS: Monitoring devices overlie the patient. Relatively low lung volumes with no focal infiltrate or overt edema. Heart size upper limits normal for technique. No effusion.  No pneumothorax. Right shoulder arthroplasty. Degenerative change in the left shoulder. IMPRESSION: No acute cardiopulmonary disease. Electronically Signed   By: Corlis Leak  Hassell M.D.   On: 03/27/2016 16:53    Cardiac Studies      Patient Profile     81 y.o. female  With HTN, tachy - brady syndrome   Assessment & Plan    1. Tachy - brady syndrome.    Rhythm has stabilized quite a bit. CHADS2VASC is at least 734( age 81, HTN,  CAD )   Will need anticoagulation at the time of DC Will continue heparin for now   2. CAD :   Has hx of coronary stenting .    Does not have angnia at present.  + troponins.   Could be demand ischemia. Will get a [employer] myoview tomorrow    3. HTN   - family brought in meds.  Have restarted amlodipine.  Will start aldactone instead of HCTZ   4. Hyperlipidemia:   Continue atrova 40 , check labs today     Signed, Kristeen MissPhilip Nahser, MD  03/28/2016, 10:14 AM

## 2016-03-28 NOTE — Progress Notes (Signed)
ANTICOAGULATION CONSULT NOTE - Follow-up Consult  Pharmacy Consult for Heparin  Indication: atrial fibrillation  No Known Allergies  Patient Measurements: Height: 5\' 2"  (157.5 cm) Weight: 152 lb (68.9 kg) IBW/kg (Calculated) : 50.1 Heparin Dosing Weight: 68.9 kg  Vital Signs: Temp: 98.3 F (36.8 C) (03/09 0400) Temp Source: Oral (03/09 0400) BP: 138/83 (03/09 0300) Pulse Rate: 51 (03/09 0300)  Labs:  Recent Labs  03/27/16 1640 03/27/16 1650 03/27/16 2240 03/28/16 0425  HGB 13.6 14.6  --  12.5  HCT 42.1 43.0  --  38.3  PLT 169  --   --  161  LABPROT  --   --   --  14.6  INR  --   --   --  1.13  HEPARINUNFRC  --   --   --  0.85*  CREATININE 1.06* 0.90  --   --   TROPONINI  --   --  0.22*  --     Estimated Creatinine Clearance: 46.8 mL/min (by C-G formula based on SCr of 0.9 mg/dL).  Assessment: 81 yr old female admitted with near syncope and tachy-brady syndrome. May need pacemaker. Pt on IV heparin for atrial fibrillation. Heparin level supratherapeutic (0.85) on gtt at 950 units/hr. Hgb down to 12.5, no bleeding noted.  Goal of Therapy:  Heparin level 0.3-0.7 units/ml Monitor platelets by anticoagulation protocol: Yes   Plan:  Decrease heparin drip to 800 units/hr. F/u 8 hr heparin level   Christoper Fabianaron Domique Reardon, PharmD, BCPS Clinical pharmacist, pager [phone] 03/28/2016,5:28 AM

## 2016-03-28 NOTE — Progress Notes (Signed)
ANTICOAGULATION CONSULT NOTE - Follow-up Consult  Pharmacy Consult for Heparin  Indication: atrial fibrillation  No Known Allergies  Patient Measurements: Height: 5\' 2"  (157.5 cm) Weight: 152 lb (68.9 kg) IBW/kg (Calculated) : 50.1 Heparin Dosing Weight: 68.9 kg  Vital Signs: Temp: 97.3 F (36.3 C) (03/09 1118) Temp Source: Oral (03/09 1118) BP: 161/80 (03/09 1100) Pulse Rate: 48 (03/09 1100)  Labs:  Recent Labs  03/27/16 1640 03/27/16 1650 03/27/16 2240 03/28/16 0425 03/28/16 1111  HGB 13.6 14.6  --  12.5  --   HCT 42.1 43.0  --  38.3  --   PLT 169  --   --  161  --   LABPROT  --   --   --  14.6  --   INR  --   --   --  1.13  --   HEPARINUNFRC  --   --   --  0.85* 0.53  CREATININE 1.06* 0.90  --  0.91  --   TROPONINI  --   --  0.22* 0.25* 0.12*    Estimated Creatinine Clearance: 46.3 mL/min (by C-G formula based on SCr of 0.91 mg/dL).  Assessment: 81 yr old female admitted with near syncope and tachy-brady syndrome. May need pacemaker. Pt on IV heparin for atrial fibrillation. Heparin level is therapeutic at 0.53 on recently reduced rate. Last Hgb 12.5, stable platelets. No bleeding noted per patient. Continue current rate and check confirmatory.  Goal of Therapy:  Heparin level 0.3-0.7 units/ml Monitor platelets by anticoagulation protocol: Yes   Plan:  Continue heparin drip at 800 units/hr.  Check daily heparin level and CBC Check 6 hr heparin level  Monitor signs and symptoms of bleeding  Chales AbrahamsJuwon Gola Bribiesca, PharmD Candidate 03/28/2016,1:00 PM

## 2016-03-28 NOTE — Progress Notes (Signed)
ANTICOAGULATION CONSULT NOTE - Follow-up Consult  Pharmacy Consult for Heparin  Indication: atrial fibrillation  No Known Allergies  Patient Measurements: Height:  (157.5 cm) Weight: 152 lb (68.9 kg) IBW/kg (Calculated) : 50.1 Heparin Dosing Weight: 68.9 kg  Vital Signs: Temp: 98.2 F (36.8 C) (03/09 1732) Temp Source: Oral (03/09 1732) BP: 111/86 (03/09 1800) Pulse Rate: 59 (03/09 1800)  Assessment: 81 yr old female admitted with near syncope and tachy-brady syndrome. May need pacemaker. Pt on IV heparin for atrial fibrillation. Heparin level remains therapeutic at 0.43.  Goal of Therapy:  Heparin level 0.3-0.7 units/ml Monitor platelets by anticoagulation protocol: Yes   Plan:  Continue heparin drip at 800 units/hr Check daily heparin level and CBC Monitor signs and symptoms of bleeding  Enzo Bi, PharmD, BCPS Clinical Pharmacist Pager [phone] 03/28/2016 7:31 PM

## 2016-03-29 ENCOUNTER — Encounter (HOSPITAL_COMMUNITY): Payer: Self-pay | Admitting: *Deleted

## 2016-03-29 DIAGNOSIS — I480 Paroxysmal atrial fibrillation: Secondary | ICD-10-CM

## 2016-03-29 DIAGNOSIS — R748 Abnormal levels of other serum enzymes: Secondary | ICD-10-CM

## 2016-03-29 DIAGNOSIS — I25119 Atherosclerotic heart disease of native coronary artery with unspecified angina pectoris: Secondary | ICD-10-CM

## 2016-03-29 LAB — MRSA CULTURE: Culture: NOT DETECTED

## 2016-03-29 LAB — CBC
HEMATOCRIT: 40.5 % (ref 36.0–46.0)
HEMOGLOBIN: 13 g/dL (ref 12.0–15.0)
MCH: 27.1 pg (ref 26.0–34.0)
MCHC: 32.1 g/dL (ref 30.0–36.0)
MCV: 84.6 fL (ref 78.0–100.0)
Platelets: 158 10*3/uL (ref 150–400)
RBC: 4.79 MIL/uL (ref 3.87–5.11)
RDW: 13.3 % (ref 11.5–15.5)
WBC: 7.2 10*3/uL (ref 4.0–10.5)

## 2016-03-29 LAB — BASIC METABOLIC PANEL
Anion gap: 8 (ref 5–15)
BUN: 10 mg/dL (ref 6–20)
CHLORIDE: 104 mmol/L (ref 101–111)
CO2: 29 mmol/L (ref 22–32)
CREATININE: 0.84 mg/dL (ref 0.44–1.00)
Calcium: 10.9 mg/dL — ABNORMAL HIGH (ref 8.9–10.3)
GFR calc Af Amer: >60 mL/min (ref >60)
GFR calc non Af Amer: >60 mL/min (ref >60)
Glucose, Bld: 79 mg/dL (ref 65–99)
POTASSIUM: 3.5 mmol/L (ref 3.5–5.1)
Sodium: 141 mmol/L (ref 135–145)

## 2016-03-29 LAB — HEPARIN LEVEL (UNFRACTIONATED): Heparin Unfractionated: 0.46 IU/mL (ref 0.30–0.70)

## 2016-03-29 MED ORDER — ASPIRIN EC 81 MG PO TBEC
81.0000 mg | DELAYED_RELEASE_TABLET | Freq: Every day | ORAL | Status: DC
  Administered 2016-03-29 – 2016-03-30 (×2): 81 mg via ORAL
  Filled 2016-03-29 (×2): qty 1

## 2016-03-29 MED ORDER — ALPRAZOLAM 0.5 MG PO TABS: 0.5000 mg | ORAL_TABLET | Freq: Every evening | ORAL | Status: DC | PRN

## 2016-03-29 MED ORDER — AMOXICILLIN 500 MG PO CAPS
500.0000 mg | ORAL_CAPSULE | Freq: Three times a day (TID) | ORAL | Status: DC
  Administered 2016-03-30 – 2016-04-01 (×8): 500 mg via ORAL
  Filled 2016-03-29 (×8): qty 1

## 2016-03-29 MED ORDER — ADULT MULTIVITAMIN W/MINERALS CH
1.0000 | ORAL_TABLET | Freq: Every day | ORAL | Status: DC
  Administered 2016-03-29 – 2016-04-01 (×4): 1 via ORAL
  Filled 2016-03-29 (×4): qty 1

## 2016-03-29 MED ORDER — NITROGLYCERIN 0.4 MG SL SUBL
0.4000 mg | SUBLINGUAL_TABLET | SUBLINGUAL | Status: DC | PRN
Start: 2016-03-29 — End: 2016-04-01

## 2016-03-29 NOTE — Progress Notes (Signed)
ANTICOAGULATION CONSULT NOTE - Follow-up Consult  Pharmacy Consult for Heparin  Indication: atrial fibrillation  No Known Allergies  Patient Measurements: Height: 5\' 2"  (157.5 cm) Weight: 154 lb 9.6 oz (70.1 kg) IBW/kg (Calculated) : 50.1 Heparin Dosing Weight: 68.9 kg  Vital Signs: Temp: 97.6 F (36.4 C) (03/10 0700) Temp Source: Oral (03/10 0700) BP: 154/85 (03/10 0907) Pulse Rate: 56 (03/10 0448)  Labs:  Recent Labs  03/27/16 1640 03/27/16 1650 03/27/16 2240  03/28/16 0425 03/28/16 1111 03/28/16 1857 03/29/16 0308  HGB 13.6 14.6  --   --  12.5  --   --  13.0  HCT 42.1 43.0  --   --  38.3  --   --  40.5  PLT 169  --   --   --  161  --   --  158  LABPROT  --   --   --   --  14.6  --   --   --   INR  --   --   --   --  1.13  --   --   --   HEPARINUNFRC  --   --   --   < > 0.85* 0.53 0.43 0.46  CREATININE 1.06* 0.90  --   --  0.91  --   --   --   TROPONINI  --   --  0.22*  --  0.25* 0.12*  --   --   < > = values in this interval not displayed.  Estimated Creatinine Clearance: 46.7 mL/min (by C-G formula based on SCr of 0.91 mg/dL).  Assessment: 81 yr old female admitted with near syncope with Afib and tachy-brady syndrome. May need pacemaker. Pt on IV heparin for atrial fibrillation. Heparin level is therapeutic at 0.46 on heparin drip 800 uts/hr. Last Hgb 12.5, stable platelets. No bleeding noted per patient.   Goal of Therapy:  Heparin level 0.3-0.7 units/ml Monitor platelets by anticoagulation protocol: Yes   Plan:  Continue heparin drip at 800 units/hr.  Check daily heparin level and CBC Monitor signs and symptoms of bleeding F/u plans for DOAC when stable and w/u complete  Leota SauersLisa Cayci Mcnabb Pharm.D. CPP, BCPS Clinical Pharmacist [phone] 03/29/2016 10:44 AM

## 2016-03-29 NOTE — Progress Notes (Addendum)
eLink Physician-Brief Progress Note Patient Name: Janet Everett DOB: 1935-01-21 MRN: [id number]   Date of Service  03/29/2016  HPI/Events of Note  Culture review by Elink.  Patient with abnormal urinalysis and greater than 100K colonies of enterococcus faecalis.    eICU Interventions  Amoxicillin x 7 days. sensitvities will need to be followed up by primary service     Intervention Category Intermediate Interventions: Infection - evaluation and management  Henry Russel, P 03/29/2016, 11:32 PM

## 2016-03-29 NOTE — Progress Notes (Signed)
Patient arrived in the unit accompanied by RN and NT. Orientation to the unit given. Patient verbalizes understanding. 

## 2016-03-29 NOTE — Progress Notes (Signed)
Progress Note  Patient Name: Janet Everett Date of Encounter: 03/29/2016  Primary Cardiologist: Dr. Kristeen Miss  Subjective   No active chest pain or breathlessness, no palpitations. Patient states that she has had some chest tightness intermittently in the last several weeks. Her symptoms of lightheadedness have been occurring in the last week.  Inpatient Medications    Scheduled Meds: . amLODipine  5 mg Oral Daily  . atorvastatin  40 mg Oral q1800  . spironolactone  25 mg Oral Daily   Continuous Infusions: . heparin 800 Units/hr (03/28/16 0532)   PRN Meds: acetaminophen, ondansetron (ZOFRAN) IV, senna   Vital Signs    Vitals:   03/29/16 0045 03/29/16 0448 03/29/16 0700 03/29/16 0907  BP: (!) 153/92 (!) 151/74  (!) 154/85  Pulse: (!) 51 (!) 56    Resp: 16 15    Temp: 98 F (36.7 C) 97.9 F (36.6 C) 97.6 F (36.4 C)   TempSrc: Oral Oral Oral   SpO2: 98% 99%    Weight:  154 lb 9.6 oz (70.1 kg)    Height:        Intake/Output Summary (Last 24 hours) at 03/29/16 1011 Last data filed at 03/29/16 0725  Gross per 24 hour  Intake              728 ml  Output             2200 ml  Net            -1472 ml   Filed Weights   03/27/16 1638 03/27/16 2134 03/29/16 0448  Weight: 155 lb (70.3 kg) 152 lb (68.9 kg) 154 lb 9.6 oz (70.1 kg)    Telemetry    Sinus rhythm and sinus bradycardia, no pauses. Personally reviewed.  ECG    Tracing from 03/27/2016 showed coarse atrial fibrillation/flutter with increased voltage and repolarization abnormalities. Personally reviewed.  Physical Exam   GEN: Elderly woman, no acute distress.   Neck: No JVD. Cardiac: RRR, 2/6 systolic murmur, no gallop.  Respiratory: Nonlabored. Clear to auscultation bilaterally. GI: Soft, nontender, bowel sounds present. MS: No edema; No deformity. Neuro:  Nonfocal. Psych: Alert and oriented x 3. Normal affect.  Labs    Chemistry Recent Labs Lab 03/27/16 1640 03/27/16 1650 03/28/16 0425    NA 140 141 141  K 3.2* 3.5 3.1*  CL 105 103 108  CO2 27  --  25  GLUCOSE 199* 197* 92  BUN 14 18 12   CREATININE 1.06* 0.90 0.91  CALCIUM 10.3  --  9.8  PROT 7.3  --   --   ALBUMIN 4.0  --   --   AST 21  --   --   ALT 17  --   --   ALKPHOS 65  --   --   BILITOT 0.6  --   --   GFRNONAA 49*  --  59*  GFRAA 57*  --  >60  ANIONGAP 8  --  8     Hematology Recent Labs Lab 03/27/16 1640 03/27/16 1650 03/28/16 0425 03/29/16 0308  WBC 9.4  --  7.0 7.2  RBC 4.99  --  4.56 4.79  HGB 13.6 14.6 12.5 13.0  HCT 42.1 43.0 38.3 40.5  MCV 84.4  --  84.0 84.6  MCH 27.3  --  27.4 27.1  MCHC 32.3  --  32.6 32.1  RDW 13.3  --  13.3 13.3  PLT 169  --  161 158  Cardiac Enzymes Recent Labs Lab 03/27/16 2240 03/28/16 0425 03/28/16 1111  TROPONINI 0.22* 0.25* 0.12*    Recent Labs Lab 03/27/16 1648  TROPIPOC 0.01     BNP Recent Labs Lab 03/27/16 1624  BNP 134.7*     Radiology    Dg Chest Portable 1 View  Result Date: 03/27/2016 CLINICAL DATA:  A-fib with SOB today. Hx of HTN. EXAM: PORTABLE CHEST - 1 VIEW COMPARISON:  none FINDINGS: Monitoring devices overlie the patient. Relatively low lung volumes with no focal infiltrate or overt edema. Heart size upper limits normal for technique. No effusion.  No pneumothorax. Right shoulder arthroplasty. Degenerative change in the left shoulder. IMPRESSION: No acute cardiopulmonary disease. Electronically Signed   By: Corlis Leak  Hassell M.D.   On: 03/27/2016 16:53    Cardiac Studies   Echocardiogram pending.  Patient Profile     81 y.o. female with a history of CAD status [place] in [place], hypertension, paroxysmal atrial fibrillation, currently admitted with near-syncope and finding of tachycardia-bradycardia syndrome with posttermination pauses.   Assessment & Plan    1. Paroxysmal atrial fibrillation/flutter with evidence of tachycardia-bradycardia syndrome and posttermination pauses. Patient has been  symptomatic over the last week describing intermittent lightheadedness. It does not look like she has been on any true AV nodal blockers. Possibly anticoagulated at some point as an outpatient, the details are not clear. CHADSVASC score is 4.  2. CAD status post stent interventions in [place], details not available. She does report intermittent chest tightness, troponin I levels mildly increased with peak 0.25.  3. Essential hypertension, on Norvasc as an outpatient.  I reviewed the chart and Dr. Harvie BridgeNahser's notes. Not entirely clear about the plan for the weekend. I have spoken with the patient and we will move her to telemetry at this point, obtain an echocardiogram for cardiac structural assessment, and ask EP to see her. It may be that she needs a pacemaker ultimately. Will eventually plan to transition to a DOAC from heparin, but would like to make sure that she does not have evidence of a significant cardiomyopathy that may require further inpatient workup such as cardiac catheterization first. The mildly abnormal troponin I levels and intermittent chest pain make this a possibility as well.  Signed, Nona DellSamuel McDowell, MD  03/29/2016, 10:11 AM

## 2016-03-30 LAB — CBC
HEMATOCRIT: 40.8 % (ref 36.0–46.0)
HEMOGLOBIN: 13.2 g/dL (ref 12.0–15.0)
MCH: 27.2 pg (ref 26.0–34.0)
MCHC: 32.4 g/dL (ref 30.0–36.0)
MCV: 84 fL (ref 78.0–100.0)
Platelets: 154 10*3/uL (ref 150–400)
RBC: 4.86 MIL/uL (ref 3.87–5.11)
RDW: 13.2 % (ref 11.5–15.5)
WBC: 7.2 10*3/uL (ref 4.0–10.5)

## 2016-03-30 LAB — BASIC METABOLIC PANEL
Anion gap: 8 (ref 5–15)
BUN: 15 mg/dL (ref 6–20)
CHLORIDE: 106 mmol/L (ref 101–111)
CO2: 24 mmol/L (ref 22–32)
Calcium: 10.7 mg/dL — ABNORMAL HIGH (ref 8.9–10.3)
Creatinine, Ser: 0.95 mg/dL (ref 0.44–1.00)
GFR calc Af Amer: >60 mL/min (ref >60)
GFR calc non Af Amer: 56 mL/min — ABNORMAL LOW (ref >60)
GLUCOSE: 108 mg/dL — AB (ref 65–99)
POTASSIUM: 3.5 mmol/L (ref 3.5–5.1)
Sodium: 138 mmol/L (ref 135–145)

## 2016-03-30 LAB — URINE CULTURE: Culture: >=100000 — AB

## 2016-03-30 LAB — HEPARIN LEVEL (UNFRACTIONATED): Heparin Unfractionated: 0.48 IU/mL (ref 0.30–0.70)

## 2016-03-30 LAB — TSH: TSH: 2.031 u[IU]/mL (ref 0.350–4.500)

## 2016-03-30 MED ORDER — SODIUM CHLORIDE 0.9 % IV SOLN: INTRAVENOUS | Status: DC

## 2016-03-30 MED ORDER — SODIUM CHLORIDE 0.9 % IR SOLN
80.0000 mg | Status: AC
  Administered 2016-03-31: 80 mg

## 2016-03-30 MED ORDER — CHLORHEXIDINE GLUCONATE 4 % EX LIQD
60.0000 mL | Freq: Once | CUTANEOUS | Status: AC
Start: 2016-03-30 — End: 2016-03-30
  Administered 2016-03-30: 4 via TOPICAL
  Filled 2016-03-30: qty 60

## 2016-03-30 MED ORDER — CEFAZOLIN SODIUM-DEXTROSE 2-4 GM/100ML-% IV SOLN
2.0000 g | INTRAVENOUS | Status: AC
Start: 2016-03-31 — End: 2016-03-31
  Administered 2016-03-31: 2 g via INTRAVENOUS

## 2016-03-30 MED ORDER — CHLORHEXIDINE GLUCONATE 4 % EX LIQD
60.0000 mL | Freq: Once | CUTANEOUS | Status: AC
  Administered 2016-03-31: 4 via TOPICAL
  Filled 2016-03-30: qty 60

## 2016-03-30 MED ORDER — SODIUM CHLORIDE 0.9 % IV SOLN
INTRAVENOUS | Status: DC
  Administered 2016-03-31: 07:00:00 via INTRAVENOUS

## 2016-03-30 NOTE — Progress Notes (Signed)
ANTICOAGULATION CONSULT NOTE - Follow-up Consult  Pharmacy Consult for Heparin  Indication: atrial fibrillation  No Known Allergies  Patient Measurements: Height: 5\' 2"  (157.5 cm) Weight: 152 lb 9.6 oz (69.2 kg) (scale b) IBW/kg (Calculated) : 50.1 Heparin Dosing Weight: 68.9 kg  Vital Signs: Temp: 98.6 F (37 C) (03/11 0611) Temp Source: Oral (03/11 0611) BP: 149/81 (03/11 0611) Pulse Rate: 48 (03/11 0611)  Labs:  Recent Labs  03/27/16 2240  03/28/16 0425 03/28/16 1111 03/28/16 1857 03/29/16 0308 03/29/16 1132 03/30/16 0318  HGB  --   --  12.5  --   --  13.0  --  13.2  HCT  --   --  38.3  --   --  40.5  --  40.8  PLT  --   --  161  --   --  158  --  154  LABPROT  --   --  14.6  --   --   --   --   --   INR  --   --  1.13  --   --   --   --   --   HEPARINUNFRC  --   < > 0.85* 0.53 0.43 0.46  --  0.48  CREATININE  --   --  0.91  --   --   --  0.84 0.95  TROPONINI 0.22*  --  0.25* 0.12*  --   --   --   --   < > = values in this interval not displayed.  Estimated Creatinine Clearance: 44.5 mL/min (by C-G formula based on SCr of 0.95 mg/dL).  Assessment: 81 yr old female admitted with near syncope with Afib and tachy-brady syndrome. May need pacemaker. Pt on IV heparin for atrial fibrillation. CHADSVASC = 4. Heparin level is therapeutic at 0.48 on heparin drip 800 uts/hr. CBC is stable. No overt signs of bleeding noted.   Goal of Therapy:  Heparin level 0.3-0.7 units/ml Monitor platelets by anticoagulation protocol: Yes   Plan:  Continue heparin drip at 800 units/hr.  Check daily heparin level and CBC Monitor signs and symptoms of bleeding F/u plans for DOAC when stable and w/u complete  [name], PharmD PGY1 Pharmacy Resident [phone] 03/30/2016 9:10 AM

## 2016-03-30 NOTE — Progress Notes (Addendum)
Pt is stable, vitals stable, plan to do pacemaker insertion tomorrow am, NPO from midnight, Consent taken, patient is aware, family member is in bed side and is updating, will continue to monitor the patient.  Lonia Farberekha, RN

## 2016-03-30 NOTE — Progress Notes (Signed)
ELECTROPHYSIOLOGY CONSULT NOTE  Patient ID: Janet Everett, MRN: [id number], DOB/AGE: 1936-01-09 81 y.o. Admit date: 03/27/2016 Date of Consult: 03/30/2016  Primary Physician: No PCP Per Patient Primary Cardiologist: Pna Consulting Physician [facility]  Chief Complaint: Atrial fib with post termination pauses   HPI Janet Everett is a 81 y.o. female  We are asked to see because of atrial fibrillation and bradycardia.  She presented to hospital because of tachycardia palpitations and presyncope. She is known to have atrial fibrillation because of similar symptoms one year ago. With these episodes, last year and this, she has a chest tightness and burning; this prompted catheterization one year ago which demonstrated by report no changes in coronary anatomy and stable stent patency.  She has a history of coronary artery disease with prior stenting. Details are unavailable. She is not very knowledgeable about her medicines. It sounds like she was taking antiplatelet therapy. She has a history of hypertension and has been on amlodipine. She has had problems with peripheral edema. She denies dyspnea on exertion or exertional chest discomfort. She does describe fatigue after working for a couple of hours work carrying her groceries from the car. She does not have nocturnal dyspnea. She does have sleep disordered breathing and daytime somnolence.        Past Medical History:  Diagnosis Date  . Atrial fibrillation and flutter [facility])       Surgical History: History reviewed. No pertinent surgical history.   Home Meds: Prior to Admission medications   Medication Sig Start Date End Date Taking? Authorizing Provider  amLODipine (NORVASC) 5 MG tablet Take 5 mg by mouth at bedtime.   Yes Historical Provider, MD  aspirin EC 81 MG tablet Take 81 mg by mouth daily.   Yes Historical Provider, MD  magnesium hydroxide (MILK OF MAGNESIA) 400 MG/5ML suspension Take 30 mLs by mouth daily as needed for mild  constipation.   Yes Historical Provider, MD  Multiple Vitamin (MULTIVITAMIN WITH MINERALS) TABS tablet Take 1 tablet by mouth daily.   Yes Historical Provider, MD  nitroGLYCERIN (NITROSTAT) 0.4 MG SL tablet Place 0.4 mg under the tongue every 5 (five) minutes as needed for chest pain.   Yes Historical Provider, MD  ALPRAZolam Prudy Feeler) 0.5 MG tablet Take 0.5 mg by mouth at bedtime as needed for anxiety.    Historical Provider, MD  atorvastatin (LIPITOR) 40 MG tablet Take 40 mg by mouth daily.    Historical Provider, MD  hydrochlorothiazide (HYDRODIURIL) 25 MG tablet Take 25 mg by mouth daily.    Historical Provider, MD  NIFEdipine (PROCARDIA XL/ADALAT-CC) 60 MG 24 hr tablet Take 60 mg by mouth daily as needed (for tachycardia).    Historical Provider, MD  triamterene-hydrochlorothiazide (MAXZIDE-25) 37.5-25 MG tablet Take 1 tablet by mouth daily.    Historical Provider, MD    Allergies: No Known Allergies  Social History   Social History  . Marital status: Married    Spouse name: N/A  . Number of children: N/A  . Years of education: N/A   Occupational History  . Not on file.   Social History Main Topics  . Smoking status: Not on file  . Smokeless tobacco: Not on file  . Alcohol use Not on file  . Drug use: Unknown  . Sexual activity: Not on file   Other Topics Concern  . Not on file   Social History Narrative  . No narrative on file     Family History  Problem Relation  Age of Onset  . Hypertension Mother      ROS:  Please see the history of present illness.     All other systems reviewed and negative.    Physical Exam: Blood pressure (!) 170/72, pulse (!) 52, temperature 98 F (36.7 C), temperature source Oral, resp. rate 18, height 5\' 2"  (1.575 m), weight 152 lb 9.6 oz (69.2 kg), SpO2 98 %. General: Well developed, well nourished female in no acute distress. Head: Normocephalic, atraumatic, sclera non-icteric, no xanthomas, nares are without discharge. EENT: normal    Lymph Nodes:  none Neck: Negative for carotid bruits. JVD not elevated. Back:without scoliosis kyphosis Lungs: Clear bilaterally to auscultation without wheezes, rales, or rhonchi. Breathing is unlabored. Heart: RRR with S1 S2. 2/6 murmur . No rubs, or gallops appreciated. Abdomen: Soft, non-tender, non-distended with normoactive bowel sounds. No hepatomegaly. No rebound/guarding. No obvious abdominal masses. Msk:  Strength and tone appear normal for age. Extremities: No clubbing or cyanosis. No edema.  Distal pedal pulses are 2+ and equal bilaterally. Skin: Warm and Dry Neuro: Alert and oriented X 3. CN III-XII intact Grossly normal sensory and motor function . Psych:  Responds to questions appropriately with a normal affect.      Labs: Cardiac Enzymes  Recent Labs  03/27/16 2240 03/28/16 0425 03/28/16 1111  TROPONINI 0.22* 0.25* 0.12*   CBC Lab Results  Component Value Date   WBC 7.2 03/30/2016   HGB 13.2 03/30/2016   HCT 40.8 03/30/2016   MCV 84.0 03/30/2016   PLT 154 03/30/2016   PROTIME:  Recent Labs  03/28/16 0425  LABPROT 14.6  INR 1.13   Chemistry  Recent Labs Lab 03/27/16 1640  03/30/16 0318  NA 140  < > 138  K 3.2*  < > 3.5  CL 105  < > 106  CO2 27  < > 24  BUN 14  < > 15  CREATININE 1.06*  < > 0.95  CALCIUM 10.3  < > 10.7*  PROT 7.3  --   --   BILITOT 0.6  --   --   ALKPHOS 65  --   --   ALT 17  --   --   AST 21  --   --   GLUCOSE 199*  < > 108*  < > = values in this interval not displayed. Lipids Lab Results  Component Value Date   CHOL 231 (H) 03/28/2016   HDL 58 03/28/2016   LDLCALC 162 (H) 03/28/2016   TRIG 54 03/28/2016   BNP No results found for: PROBNP Thyroid Function Tests: No results for input(s): TSH, T4TOTAL, T3FREE, THYROIDAB in the last 72 hours.  Invalid input(s): FREET3 Miscellaneous No results found for: DDIMER  Radiology/Studies:  Dg Chest Portable 1 View  Result Date: 03/27/2016 CLINICAL DATA:  A-fib with  SOB today. Hx of HTN. EXAM: PORTABLE CHEST - 1 VIEW COMPARISON:  none FINDINGS: Monitoring devices overlie the patient. Relatively low lung volumes with no focal infiltrate or overt edema. Heart size upper limits normal for technique. No effusion.  No pneumothorax. Right shoulder arthroplasty. Degenerative change in the left shoulder. IMPRESSION: No acute cardiopulmonary disease. Electronically Signed   By: Corlis Leak M.D.   On: 03/27/2016 16:53    ECG personally reviewed sinus and 48 15/08/44 LVH with repolarization abnormalities  Telemetry reviewed. Frequent runs of nonsustained atrial fibrillation associated with posttermination pauses and residual sinus bradycardia. Abrupt termination of these events noted about 12 hours after admission  Assessment and Plan:  Atrial fibrillation-paroxysmal with rapid ventricular response  Sinus node dysfunction with posttermination pauses and sinus bradycardia  Coronary artery disease with prior stenting  Positive troponin  Hypertension-poorly controlled  Left ventricular hypertrophy with repolarization abnormalities  Sleep disordered breathing and daytime somnolence  Chest discomfort associated with atrial fibrillation   The patient has paroxysms of rapid atrial fibrillation complicated by presyncope associated with posttermination pauses as well as resting sinus bradycardia. It becomes difficult to treat the paroxysms of rapid atrial fibrillation because of the bradycardia; hence, dual-chamber pacing is indicated.  The benefits and risks were reviewed including but not limited to death,  perforation, infection, lead dislodgement and device malfunction.  The patient understands agrees and is willing to proceed.  This will allow treatment of tachycardia; we will anticipate discontinuation of her amlodipine which may be contributing to her peripheral edema and using beta blockers and alternative calcium blockers for rate control.  We have discussed  the role of re-assessment of her coronary artery anatomy given her positive troponin.  The fact that she underwent catheterization 1 year ago for similar symptoms and has had no intercurrent problems with chest discomfort apart from the associations with atrial fibrillation make it far more likely I think that the atrial fibrillation resulted in demand ischemia and that ischemia provoked atrial fibrillation.  I suspect the troponins are demand ischemia and not an acute coronary syndrome, hence, the value of intervention would be modification of symptoms which we will place an accomplished with medication  She will need outpatient sleep study    Sherryl MangesSteven Amram Maya

## 2016-03-31 ENCOUNTER — Inpatient Hospital Stay (HOSPITAL_COMMUNITY): Payer: Medicare HMO

## 2016-03-31 ENCOUNTER — Encounter (HOSPITAL_COMMUNITY): Payer: Self-pay | Admitting: Cardiology

## 2016-03-31 ENCOUNTER — Other Ambulatory Visit (HOSPITAL_COMMUNITY): Payer: Medicare HMO

## 2016-03-31 ENCOUNTER — Encounter (HOSPITAL_COMMUNITY)
Admission: EM | Disposition: A | Discharge status: Home / Self Care | Payer: Self-pay | Source: Home / Self Care | Attending: Cardiovascular Disease

## 2016-03-31 DIAGNOSIS — I495 Sick sinus syndrome: Secondary | ICD-10-CM

## 2016-03-31 DIAGNOSIS — I4891 Unspecified atrial fibrillation: Secondary | ICD-10-CM

## 2016-03-31 HISTORY — PX: PACEMAKER IMPLANT: EP1218

## 2016-03-31 LAB — CBC
HEMATOCRIT: 44.2 % (ref 36.0–46.0)
Hemoglobin: 14.7 g/dL (ref 12.0–15.0)
MCH: 28 pg (ref 26.0–34.0)
MCHC: 33.3 g/dL (ref 30.0–36.0)
MCV: 84.2 fL (ref 78.0–100.0)
Platelets: 158 10*3/uL (ref 150–400)
RBC: 5.25 MIL/uL — ABNORMAL HIGH (ref 3.87–5.11)
RDW: 13.6 % (ref 11.5–15.5)
WBC: 7.6 10*3/uL (ref 4.0–10.5)

## 2016-03-31 LAB — MRSA PCR SCREENING: MRSA by PCR: NEGATIVE

## 2016-03-31 LAB — ECHOCARDIOGRAM COMPLETE
HEIGHTINCHES: 62 in
WEIGHTICAEL: 2448 [oz_av]

## 2016-03-31 LAB — SURGICAL PCR SCREEN
MRSA, PCR: NEGATIVE
Staphylococcus aureus: NEGATIVE

## 2016-03-31 SURGERY — PACEMAKER IMPLANT
Anesthesia: LOCAL

## 2016-03-31 MED ORDER — ONDANSETRON HCL 4 MG/2ML IJ SOLN: 4.0000 mg | Freq: Four times a day (QID) | INTRAMUSCULAR | Status: DC | PRN

## 2016-03-31 MED ORDER — MIDAZOLAM HCL 5 MG/5ML IJ SOLN
INTRAMUSCULAR | Status: AC
  Filled 2016-03-31: qty 5

## 2016-03-31 MED ORDER — FENTANYL CITRATE (PF) 100 MCG/2ML IJ SOLN
INTRAMUSCULAR | Status: AC
  Filled 2016-03-31: qty 2

## 2016-03-31 MED ORDER — FENTANYL CITRATE (PF) 100 MCG/2ML IJ SOLN
INTRAMUSCULAR | Status: DC | PRN
  Administered 2016-03-31: 25 ug via INTRAVENOUS

## 2016-03-31 MED ORDER — CEFAZOLIN SODIUM-DEXTROSE 2-4 GM/100ML-% IV SOLN
INTRAVENOUS | Status: AC
  Filled 2016-03-31: qty 100

## 2016-03-31 MED ORDER — PERFLUTREN LIPID MICROSPHERE
1.0000 mL | INTRAVENOUS | Status: AC | PRN
Start: 2016-03-31 — End: 2016-03-31
  Administered 2016-03-31: 4 mL via INTRAVENOUS
  Filled 2016-03-31: qty 10

## 2016-03-31 MED ORDER — LIDOCAINE HCL (PF) 1 % IJ SOLN
INTRAMUSCULAR | Status: AC
  Filled 2016-03-31: qty 60

## 2016-03-31 MED ORDER — HEPARIN (PORCINE) IN NACL 2-0.9 UNIT/ML-% IJ SOLN
INTRAMUSCULAR | Status: AC
  Filled 2016-03-31: qty 500

## 2016-03-31 MED ORDER — ACETAMINOPHEN 325 MG PO TABS
325.0000 mg | ORAL_TABLET | ORAL | Status: DC | PRN
Start: 2016-03-31 — End: 2016-04-01
  Administered 2016-03-31 (×2): 650 mg via ORAL
  Filled 2016-03-31 (×2): qty 2

## 2016-03-31 MED ORDER — CEFAZOLIN IN D5W 1 GM/50ML IV SOLN
1.0000 g | Freq: Four times a day (QID) | INTRAVENOUS | Status: AC
  Administered 2016-03-31 – 2016-04-01 (×3): 1 g via INTRAVENOUS
  Filled 2016-03-31 (×3): qty 50

## 2016-03-31 MED ORDER — LIDOCAINE HCL (PF) 1 % IJ SOLN
INTRAMUSCULAR | Status: DC | PRN
  Administered 2016-03-31: 35 mL via INTRADERMAL

## 2016-03-31 MED ORDER — MIDAZOLAM HCL 5 MG/5ML IJ SOLN
INTRAMUSCULAR | Status: DC | PRN
  Administered 2016-03-31 (×2): 1 mg via INTRAVENOUS

## 2016-03-31 MED ORDER — SODIUM CHLORIDE 0.9 % IR SOLN
Status: AC
  Filled 2016-03-31: qty 2

## 2016-03-31 MED ORDER — HEPARIN (PORCINE) IN NACL 2-0.9 UNIT/ML-% IJ SOLN
INTRAMUSCULAR | Status: DC | PRN
  Administered 2016-03-31: 08:00:00

## 2016-03-31 MED ORDER — DILTIAZEM HCL ER COATED BEADS 240 MG PO CP24
240.0000 mg | ORAL_CAPSULE | Freq: Every day | ORAL | Status: DC
  Administered 2016-03-31 – 2016-04-01 (×2): 240 mg via ORAL
  Filled 2016-03-31 (×2): qty 1

## 2016-03-31 SURGICAL SUPPLY — 8 items
CABLE SURGICAL S-101-97-12 (CABLE) ×2 IMPLANT
HEMOSTAT SURGICEL 2X4 FIBR (HEMOSTASIS) ×2 IMPLANT
LEAD TENDRIL MRI 46CM LPA1200M (Lead) ×2 IMPLANT
LEAD TENDRIL MRI 52CM LPA1200M (Lead) ×2 IMPLANT
PACEMAKER ASSURITY DR-RF (Pacemaker) ×2 IMPLANT
PAD DEFIB LIFELINK (PAD) ×2 IMPLANT
SHEATH CLASSIC 8F (SHEATH) ×4 IMPLANT
TRAY PACEMAKER INSERTION (PACKS) ×2 IMPLANT

## 2016-03-31 NOTE — Interval H&P Note (Signed)
History and Physical Interval Note:  03/31/2016 7:36 AM  Janet Everett  has presented today for surgery, with the diagnosis of afib pauses  The various methods of treatment have been discussed with the patient and family. After consideration of risks, benefits and other options for treatment, the patient has consented to  Procedure(s): Pacemaker Implant (N/A) as a surgical intervention .  The patient's history has been reviewed, patient examined, no change in status, stable for surgery.  I have reviewed the patient's chart and labs.  Questions were answered to the patient's satisfaction.     Sherryl MangesSteven Titus Drone   reviewewd plan with patient All questions answered

## 2016-03-31 NOTE — Progress Notes (Signed)
  Echocardiogram 2D Echocardiogram has been performed.  Delcie RochENNINGTON, Geniyah Eischeid 03/31/2016, 3:34 PM

## 2016-03-31 NOTE — Progress Notes (Addendum)
Patient has returned from cath lab with left arm sling. Patient alert and oriented. Left chest surgical site WNL. Vitals obtained. Diet order placed. Patient has called in lunch order. Morning medications given with graham crackers and peanut butter. Husband at bedside.

## 2016-03-31 NOTE — H&P (View-Only) (Signed)
ELECTROPHYSIOLOGY CONSULT NOTE  Patient ID: Janet Everett, MRN: [id number], DOB/AGE: 1936-01-09 81 y.o. Admit date: 03/27/2016 Date of Consult: 03/30/2016  Primary Physician: No PCP Per Patient Primary Cardiologist: Pna Consulting Physician [facility]  Chief Complaint: Atrial fib with post termination pauses   HPI Janet Everett is a 81 y.o. female  We are asked to see because of atrial fibrillation and bradycardia.  She presented to hospital because of tachycardia palpitations and presyncope. She is known to have atrial fibrillation because of similar symptoms one year ago. With these episodes, last year and this, she has a chest tightness and burning; this prompted catheterization one year ago which demonstrated by report no changes in coronary anatomy and stable stent patency.  She has a history of coronary artery disease with prior stenting. Details are unavailable. She is not very knowledgeable about her medicines. It sounds like she was taking antiplatelet therapy. She has a history of hypertension and has been on amlodipine. She has had problems with peripheral edema. She denies dyspnea on exertion or exertional chest discomfort. She does describe fatigue after working for a couple of hours work carrying her groceries from the car. She does not have nocturnal dyspnea. She does have sleep disordered breathing and daytime somnolence.        Past Medical History:  Diagnosis Date  . Atrial fibrillation and flutter [facility])       Surgical History: History reviewed. No pertinent surgical history.   Home Meds: Prior to Admission medications   Medication Sig Start Date End Date Taking? Authorizing Provider  amLODipine (NORVASC) 5 MG tablet Take 5 mg by mouth at bedtime.   Yes Historical Provider, MD  aspirin EC 81 MG tablet Take 81 mg by mouth daily.   Yes Historical Provider, MD  magnesium hydroxide (MILK OF MAGNESIA) 400 MG/5ML suspension Take 30 mLs by mouth daily as needed for mild  constipation.   Yes Historical Provider, MD  Multiple Vitamin (MULTIVITAMIN WITH MINERALS) TABS tablet Take 1 tablet by mouth daily.   Yes Historical Provider, MD  nitroGLYCERIN (NITROSTAT) 0.4 MG SL tablet Place 0.4 mg under the tongue every 5 (five) minutes as needed for chest pain.   Yes Historical Provider, MD  ALPRAZolam Prudy Feeler) 0.5 MG tablet Take 0.5 mg by mouth at bedtime as needed for anxiety.    Historical Provider, MD  atorvastatin (LIPITOR) 40 MG tablet Take 40 mg by mouth daily.    Historical Provider, MD  hydrochlorothiazide (HYDRODIURIL) 25 MG tablet Take 25 mg by mouth daily.    Historical Provider, MD  NIFEdipine (PROCARDIA XL/ADALAT-CC) 60 MG 24 hr tablet Take 60 mg by mouth daily as needed (for tachycardia).    Historical Provider, MD  triamterene-hydrochlorothiazide (MAXZIDE-25) 37.5-25 MG tablet Take 1 tablet by mouth daily.    Historical Provider, MD    Allergies: No Known Allergies  Social History   Social History  . Marital status: Married    Spouse name: N/A  . Number of children: N/A  . Years of education: N/A   Occupational History  . Not on file.   Social History Main Topics  . Smoking status: Not on file  . Smokeless tobacco: Not on file  . Alcohol use Not on file  . Drug use: Unknown  . Sexual activity: Not on file   Other Topics Concern  . Not on file   Social History Narrative  . No narrative on file     Family History  Problem Relation  Age of Onset  . Hypertension Mother      ROS:  Please see the history of present illness.     All other systems reviewed and negative.    Physical Exam: Blood pressure (!) 170/72, pulse (!) 52, temperature 98 F (36.7 C), temperature source Oral, resp. rate 18, height 5\' 2"  (1.575 m), weight 152 lb 9.6 oz (69.2 kg), SpO2 98 %. General: Well developed, well nourished female in no acute distress. Head: Normocephalic, atraumatic, sclera non-icteric, no xanthomas, nares are without discharge. EENT: normal    Lymph Nodes:  none Neck: Negative for carotid bruits. JVD not elevated. Back:without scoliosis kyphosis Lungs: Clear bilaterally to auscultation without wheezes, rales, or rhonchi. Breathing is unlabored. Heart: RRR with S1 S2. 2/6 murmur . No rubs, or gallops appreciated. Abdomen: Soft, non-tender, non-distended with normoactive bowel sounds. No hepatomegaly. No rebound/guarding. No obvious abdominal masses. Msk:  Strength and tone appear normal for age. Extremities: No clubbing or cyanosis. No edema.  Distal pedal pulses are 2+ and equal bilaterally. Skin: Warm and Dry Neuro: Alert and oriented X 3. CN III-XII intact Grossly normal sensory and motor function . Psych:  Responds to questions appropriately with a normal affect.      Labs: Cardiac Enzymes  Recent Labs  03/27/16 2240 03/28/16 0425 03/28/16 1111  TROPONINI 0.22* 0.25* 0.12*   CBC Lab Results  Component Value Date   WBC 7.2 03/30/2016   HGB 13.2 03/30/2016   HCT 40.8 03/30/2016   MCV 84.0 03/30/2016   PLT 154 03/30/2016   PROTIME:  Recent Labs  03/28/16 0425  LABPROT 14.6  INR 1.13   Chemistry  Recent Labs Lab 03/27/16 1640  03/30/16 0318  NA 140  < > 138  K 3.2*  < > 3.5  CL 105  < > 106  CO2 27  < > 24  BUN 14  < > 15  CREATININE 1.06*  < > 0.95  CALCIUM 10.3  < > 10.7*  PROT 7.3  --   --   BILITOT 0.6  --   --   ALKPHOS 65  --   --   ALT 17  --   --   AST 21  --   --   GLUCOSE 199*  < > 108*  < > = values in this interval not displayed. Lipids Lab Results  Component Value Date   CHOL 231 (H) 03/28/2016   HDL 58 03/28/2016   LDLCALC 162 (H) 03/28/2016   TRIG 54 03/28/2016   BNP No results found for: PROBNP Thyroid Function Tests: No results for input(s): TSH, T4TOTAL, T3FREE, THYROIDAB in the last 72 hours.  Invalid input(s): FREET3 Miscellaneous No results found for: DDIMER  Radiology/Studies:  Dg Chest Portable 1 View  Result Date: 03/27/2016 CLINICAL DATA:  A-fib with  SOB today. Hx of HTN. EXAM: PORTABLE CHEST - 1 VIEW COMPARISON:  none FINDINGS: Monitoring devices overlie the patient. Relatively low lung volumes with no focal infiltrate or overt edema. Heart size upper limits normal for technique. No effusion.  No pneumothorax. Right shoulder arthroplasty. Degenerative change in the left shoulder. IMPRESSION: No acute cardiopulmonary disease. Electronically Signed   By: Corlis Leak M.D.   On: 03/27/2016 16:53    ECG personally reviewed sinus and 48 15/08/44 LVH with repolarization abnormalities  Telemetry reviewed. Frequent runs of nonsustained atrial fibrillation associated with posttermination pauses and residual sinus bradycardia. Abrupt termination of these events noted about 12 hours after admission  Assessment and Plan:  Atrial fibrillation-paroxysmal with rapid ventricular response  Sinus node dysfunction with posttermination pauses and sinus bradycardia  Coronary artery disease with prior stenting  Positive troponin  Hypertension-poorly controlled  Left ventricular hypertrophy with repolarization abnormalities  Sleep disordered breathing and daytime somnolence  Chest discomfort associated with atrial fibrillation   The patient has paroxysms of rapid atrial fibrillation complicated by presyncope associated with posttermination pauses as well as resting sinus bradycardia. It becomes difficult to treat the paroxysms of rapid atrial fibrillation because of the bradycardia; hence, dual-chamber pacing is indicated.  The benefits and risks were reviewed including but not limited to death,  perforation, infection, lead dislodgement and device malfunction.  The patient understands agrees and is willing to proceed.  This will allow treatment of tachycardia; we will anticipate discontinuation of her amlodipine which may be contributing to her peripheral edema and using beta blockers and alternative calcium blockers for rate control.  We have discussed  the role of re-assessment of her coronary artery anatomy given her positive troponin.  The fact that she underwent catheterization 1 year ago for similar symptoms and has had no intercurrent problems with chest discomfort apart from the associations with atrial fibrillation make it far more likely I think that the atrial fibrillation resulted in demand ischemia and that ischemia provoked atrial fibrillation.  I suspect the troponins are demand ischemia and not an acute coronary syndrome, hence, the value of intervention would be modification of symptoms which we will place an accomplished with medication  She will need outpatient sleep study    Sherryl MangesSteven Daulton Harbaugh

## 2016-04-01 ENCOUNTER — Encounter (HOSPITAL_COMMUNITY): Payer: Self-pay | Admitting: Internal Medicine

## 2016-04-01 ENCOUNTER — Inpatient Hospital Stay (HOSPITAL_COMMUNITY): Payer: Medicare HMO

## 2016-04-01 DIAGNOSIS — I10 Essential (primary) hypertension: Secondary | ICD-10-CM

## 2016-04-01 DIAGNOSIS — R42 Dizziness and giddiness: Secondary | ICD-10-CM

## 2016-04-01 DIAGNOSIS — I484 Atypical atrial flutter: Secondary | ICD-10-CM

## 2016-04-01 HISTORY — DX: Dizziness and giddiness: R42

## 2016-04-01 HISTORY — DX: Essential (primary) hypertension: I10

## 2016-04-01 LAB — CBC
HCT: 41.2 % (ref 36.0–46.0)
HEMOGLOBIN: 13.2 g/dL (ref 12.0–15.0)
MCH: 26.9 pg (ref 26.0–34.0)
MCHC: 32 g/dL (ref 30.0–36.0)
MCV: 83.9 fL (ref 78.0–100.0)
Platelets: 139 10*3/uL — ABNORMAL LOW (ref 150–400)
RBC: 4.91 MIL/uL (ref 3.87–5.11)
RDW: 13.4 % (ref 11.5–15.5)
WBC: 7 10*3/uL (ref 4.0–10.5)

## 2016-04-01 MED ORDER — SPIRONOLACTONE 25 MG PO TABS: 25.0000 mg | ORAL_TABLET | Freq: Every day | ORAL | 2 refills | Status: DC

## 2016-04-01 MED ORDER — RIVAROXABAN 20 MG PO TABS: 20.0000 mg | ORAL_TABLET | Freq: Every day | ORAL | 1 refills | Status: DC

## 2016-04-01 MED ORDER — DILTIAZEM HCL ER COATED BEADS 240 MG PO CP24: 240.0000 mg | ORAL_CAPSULE | Freq: Every day | ORAL | 2 refills | Status: DC

## 2016-04-01 NOTE — Progress Notes (Signed)
Pt discharge education provided at bedside with pt husband. Pt pacemaker teaching completed. Pt IV discontinued, catheter intact. Pt telemetry removed. Pt has all discharge paperwork and belongings. Pt discharged via wheelchair with volunteers.  Janet Everett Elige RadonBradley

## 2016-04-01 NOTE — Discharge Summary (Signed)
ELECTROPHYSIOLOGY PROCEDURE DISCHARGE SUMMARY    Patient ID: Janet Everett,  MRN: [id number], DOB/AGE: 79/01/1935 79 y.o.  Admit date: 03/27/2016 Discharge date: 04/01/2016  Primary Care Physician: No PCP Per Patient Primary Cardiologist: Nahser (new this admission) Electrophysiologist: Graciela HusbandsKlein (new this admission)  Primary Discharge Diagnosis:  Tachy/brady syndrome status post pacemaker implantation this admission  Secondary Discharge Diagnosis:  1.  Paroxysmal atrial fibrillation 2.  CAD s/p prior stenting 3.  HTN  No Known Allergies   Procedures This Admission:  1.  Echo 03/31/16 demonstrated EF 70-75%, no RWMA 2.  Implantation of a STJ dual chamber PPM on 03/31/16 by Dr Graciela HusbandsKlein.  See op note for full details. There were no immediate post procedure complications. 2.  CXR on 04/01/16 demonstrated no pneumothorax status post device implantation.   Brief HPI/Hospital Course:  Janet RiggerLeeonia Checketts is a 81 y.o. female with a past medical history as outlined above. She presented to the hospital for evaluation of near syncope. She was found to have post termination pauses as well as sinus node dysfunction.  She was seen by Dr Graciela HusbandsKlein who recommended pacemaker implantation.  Risks, benefits, and alternatives to PPM implantation were reviewed with the patient who wished to proceed. The patient underwent implantation of a STJ dual chamber pacemaker with details as outlined above. Left chest was without hematoma or ecchymosis.  The device was interrogated and found to be functioning normally.  CXR was obtained and demonstrated no pneumothorax status post device implantation.  Wound care, arm mobility, and restrictions were reviewed with the patient.  The patient was examined and considered stable for discharge to home.   Per Dr Graciela HusbandsKlein, will start NOAC in 48 hours [facility].  She will also need BMET in 2 weeks.    Physical Exam: Vitals:   03/31/16 2147 04/01/16 0444 04/01/16 0450 04/01/16  0826  BP:  (!) 145/80  125/74  Pulse:    64  Resp:  18  18  Temp: 98.8 F (37.1 C) 98.8 F (37.1 C)  98.9 F (37.2 C)  TempSrc:  Oral  Oral  SpO2:  100%  96%  Weight:   152 lb 12.8 oz (69.3 kg)   Height:         Labs:   Lab Results  Component Value Date   WBC 7.0 04/01/2016   HGB 13.2 04/01/2016   HCT 41.2 04/01/2016   MCV 83.9 04/01/2016   PLT 139 (L) 04/01/2016    Recent Labs Lab 03/27/16 1640  03/30/16 0318  NA 140  < > 138  K 3.2*  < > 3.5  CL 105  < > 106  CO2 27  < > 24  BUN 14  < > 15  CREATININE 1.06*  < > 0.95  CALCIUM 10.3  < > 10.7*  PROT 7.3  --   --   BILITOT 0.6  --   --   ALKPHOS 65  --   --   ALT 17  --   --   AST 21  --   --   GLUCOSE 199*  < > 108*  < > = values in this interval not displayed.  Discharge Medications:  Allergies as of 04/01/2016   No Known Allergies     Medication List    STOP taking these medications   amLODipine 5 MG tablet Commonly known as:  NORVASC   aspirin EC 81 MG tablet   hydrochlorothiazide 25 MG tablet Commonly known as:  HYDRODIURIL  NIFEdipine 60 MG 24 hr tablet Commonly known as:  PROCARDIA XL/ADALAT-CC   triamterene-hydrochlorothiazide 37.5-25 MG tablet Commonly known as:  MAXZIDE-25     TAKE these medications   ALPRAZolam 0.5 MG tablet Commonly known as:  XANAX Take 0.5 mg by mouth at bedtime as needed for anxiety.   atorvastatin 40 MG tablet Commonly known as:  LIPITOR Take 40 mg by mouth daily.   diltiazem 240 MG 24 hr capsule Commonly known as:  CARDIZEM CD Take 1 capsule (240 mg total) by mouth daily. Start taking on:  04/02/2016   magnesium hydroxide 400 MG/5ML suspension Commonly known as:  MILK OF MAGNESIA Take 30 mLs by mouth daily as needed for mild constipation.   multivitamin with minerals Tabs tablet Take 1 tablet by mouth daily.   nitroGLYCERIN 0.4 MG SL tablet Commonly known as:  NITROSTAT Place 0.4 mg under the tongue every 5 (five) minutes as needed for chest  pain.   rivaroxaban 20 MG Tabs tablet Commonly known as:  XARELTO Take 1 tablet (20 mg total) by mouth daily with supper. Start 04/03/16   spironolactone 25 MG tablet Commonly known as:  ALDACTONE Take 1 tablet (25 mg total) by mouth daily. Start taking on:  04/02/2016       Disposition:  Discharge Instructions    Diet - low sodium heart healthy    Complete by:  As directed    Increase activity slowly    Complete by:  As directed      Follow-up Information    [facility] Follow up on 04/16/2016.   Specialty:  Cardiology Why:  at [facility] for wound check and labs  Contact information: [address], [address] [place] [address] [phone]       Sheilah Pigeon, [place] Follow up on 05/14/2016.   Specialty:  Cardiology Why:  at [facility] information: [address] [place] [address] [phone]           Duration of Discharge Encounter: Greater than 30 minutes including physician time.  Signed, Gypsy Balsam, NP 04/01/2016 10:22 AM   See separate note

## 2016-04-01 NOTE — Discharge Instructions (Signed)
° ° °  Supplemental Discharge Instructions for  Pacemaker/Defibrillator Patients  Activity No heavy lifting or vigorous activity with your left/right arm for 6 to 8 weeks.  Do not raise your left/right arm above your head for one week.  Gradually raise your affected arm as drawn below.           __       04/05/16                      04/06/16                       04/07/16                 04/08/16  NO DRIVING for  1 week   ; you may begin driving on  1/61/093/20/18   .  WOUND CARE - Keep the wound area clean and dry.    - The tape/steri-strips on your wound will fall off; do not pull them off.  No bandage is needed on the site.  DO  NOT apply any creams, oils, or ointments to the wound area. - If you notice any drainage or discharge from the wound, any swelling or bruising at the site, or you develop a fever > 101? F after you are discharged home, call the office at once.  Special Instructions - You are still able to use cellular telephones; use the ear opposite the side where you have your pacemaker/defibrillator.  Avoid carrying your cellular phone near your device. - When traveling through airports, show security personnel your identification card to avoid being screened in the metal detectors.  Ask the security personnel to use the hand wand. - Avoid arc welding equipment, MRI testing (magnetic resonance imaging), TENS units (transcutaneous nerve stimulators).  Call the office for questions about other devices. - Avoid electrical appliances that are in poor condition or are not properly grounded. - Microwave ovens are safe to be near or to operate.

## 2016-04-01 NOTE — Care Management Important Message (Signed)
Important Message  Patient Details  Name: Janet Everett MRN: [id number] Date of Birth: 09/22/1935   Medicare Important Message Given:  Yes    Jairy Angulo Stefan ChurchBratton 04/01/2016, 12:07 PM

## 2016-04-01 NOTE — Progress Notes (Signed)
Patient Name: Janet Everett      SUBJECTIVE: without complaint  Past Medical History:  Diagnosis Date  . Atrial fibrillation and flutter (HCC)     Scheduled Meds:  Scheduled Meds: . amoxicillin  500 mg Oral Q8H  . atorvastatin  40 mg Oral q1800  . diltiazem  240 mg Oral Daily  . multivitamin with minerals  1 tablet Oral Daily  . spironolactone  25 mg Oral Daily   Continuous Infusions: acetaminophen, ALPRAZolam, nitroGLYCERIN, ondansetron (ZOFRAN) IV, senna    PHYSICAL EXAM Vitals:   03/31/16 2147 04/01/16 0444 04/01/16 0450 04/01/16 0826  BP:  (!) 145/80  125/74  Pulse:    64  Resp:  18  18  Temp: 98.8 F (37.1 C) 98.8 F (37.1 C)  98.9 F (37.2 C)  TempSrc:  Oral  Oral  SpO2:  100%  96%  Weight:   152 lb 12.8 oz (69.3 kg)   Height:        Well developed and nourished in no acute distress HENT normal Neck supple with JVP-flat Clear Pocket without hematoma, swelling or tenderness  Regular rate and rhythm, no murmurs or gallops Abd-soft with active BS No Clubbing cyanosis edema Skin-warm and dry A & Oriented  Grossly normal sensory and motor function   TELEMETRY: Reviewed personnally pt in atrial paced  ECG personally reviewed  aPACING Chest Xray personally reviewed NO PNEUMO STABLELEADS  Intake/Output Summary (Last 24 hours) at 04/01/16 0849 Last data filed at 03/31/16 2240  Gross per 24 hour  Intake              650 ml  Output             1100 ml  Net             -450 ml    LABS: Basic Metabolic Panel:  Recent Labs Lab 03/27/16 1640 03/27/16 1650 03/28/16 0425 03/29/16 1132 03/30/16 0318  NA 140 141 141 141 138  K 3.2* 3.5 3.1* 3.5 3.5  CL 105 103 108 104 106  CO2 27  --  25 29 24   GLUCOSE 199* 197* 92 79 108*  BUN 14 18 12 10 15   CREATININE 1.06* 0.90 0.91 0.84 0.95  CALCIUM 10.3  --  9.8 10.9* 10.7*  MG 2.2  --   --   --   --    Cardiac Enzymes: No results for input(s): CKTOTAL, CKMB, CKMBINDEX, TROPONINI in the last  72 hours. CBC:  Recent Labs Lab 03/27/16 1640 03/27/16 1650 03/28/16 0425 03/29/16 0308 03/30/16 0318 03/31/16 0542 04/01/16 0357  WBC 9.4  --  7.0 7.2 7.2 7.6 7.0  NEUTROABS 6.8  --   --   --   --   --   --   HGB 13.6 14.6 12.5 13.0 13.2 14.7 13.2  HCT 42.1 43.0 38.3 40.5 40.8 44.2 41.2  MCV 84.4  --  84.0 84.6 84.0 84.2 83.9  PLT 169  --  161 158 154 158 139*   PROTIME: No results for input(s): LABPROT, INR in the last 72 hours. Liver Function Tests: No results for input(s): AST, ALT, ALKPHOS, BILITOT, PROT, ALBUMIN in the last 72 hours. No results for input(s): LIPASE, AMYLASE in the last 72 hours. BNP: BNP (last 3 results)  Recent Labs  03/27/16 1624  BNP 134.7*    ProBNP (last 3 results) No results for input(s): PROBNP in the last 8760 hours.  D-Dimer: No results for  input(s): DDIMER in the last 72 hours. Hemoglobin A1C: No results for input(s): HGBA1C in the last 72 hours. Fasting Lipid Panel: No results for input(s): CHOL, HDL, LDLCALC, TRIG, CHOLHDL, LDLDIRECT in the last 72 hours. Thyroid Function Tests:  Recent Labs  03/30/16 1209  TSH 2.031   Anemia Panel: No results for input(s): VITAMINB12, FOLATE, FERRITIN, TIBC, IRON, RETICCTPCT in the last 72 hours.   Echo  NOrmal LV function   Device Interrogation:  Normal device function   ASSESSMENT AND PLAN:  Active Problems:   Tachy-brady syndrome (HCC) ok to discharge  Will attempt to uptitrate CCB as needed Begin NOAC in 48 hrs Will need BMET in 2 weeks gven introduction of aldactone for BP and Hypokalemia   Signed, Sherryl MangesSteven Annaliesa Blann MD  04/01/2016

## 2016-04-16 ENCOUNTER — Ambulatory Visit (INDEPENDENT_AMBULATORY_CARE_PROVIDER_SITE_OTHER): Payer: Medicare HMO | Admitting: *Deleted

## 2016-04-16 VITALS — BP 124/78 | HR 60

## 2016-04-16 DIAGNOSIS — I495 Sick sinus syndrome: Secondary | ICD-10-CM | POA: Diagnosis not present

## 2016-04-16 NOTE — Progress Notes (Signed)
Wound check appointment. Dermabond. Wound without redness or edema. Incision edges approximated, wound well healed. Normal device function. Thresholds, sensing, and impedances consistent with implant measurements. Device programmed at 3.5V for extra safety margin until 3 month visit. Histogram distribution appropriate for patient and level of activity. No mode switches or high ventricular rates noted. Patient educated about wound care, arm mobility, lifting restrictions. ROV in 3 months with implanting physician.

## 2016-04-17 LAB — BASIC METABOLIC PANEL
BUN/Creatinine Ratio: 17 (ref 12–28)
BUN: 17 mg/dL (ref 8–27)
CALCIUM: 10.7 mg/dL — AB (ref 8.7–10.3)
CHLORIDE: 102 mmol/L (ref 96–106)
CO2: 23 mmol/L (ref 18–29)
Creatinine, Ser: 0.98 mg/dL (ref 0.57–1.00)
GFR calc Af Amer: 64 mL/min/{1.73_m2} (ref >59)
GFR calc non Af Amer: 55 mL/min/{1.73_m2} — ABNORMAL LOW (ref >59)
GLUCOSE: 150 mg/dL — AB (ref 65–99)
POTASSIUM: 3.9 mmol/L (ref 3.5–5.2)
SODIUM: 140 mmol/L (ref 134–144)

## 2016-04-28 ENCOUNTER — Encounter: Payer: Self-pay | Admitting: Physician Assistant

## 2016-05-12 NOTE — Progress Notes (Signed)
Cardiology Office Note Date:  05/14/2016  Patient ID:  Janet Everett, DOB [birth year], MRN [id number] PCP:  No PCP Per Patient  Cardiologist:  Dr. Elease Hashimoto (new March 2018 hospitalization) Electrophysiologist: Dr. Graciela Husbands (new)   Chief Complaint: post hospital scheduled visit  History of Present Illness: Janet Everett is a 81 y.o. 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 Graciela Husbands, CAD, HTN.  In review of her hospital notes, there is report of a cardiac cath in [place] a year ago with stable CAD, she had reports of an element of CP associated with RAFib,with mildly elevated Trop,  this was felt by Dr. Graciela Husbands given reports of similar symptoms with cath reported that this was demand ischemia 2/2 her rapid AF.  She underwent PPM implant and discharged on CCB for rate managements with plans to titrate as needed, as well as aldactone for BP/hypokalemia treatment, f/u BMET was stable.  She is feeling very well, active around her house, able to do all of her ADLs without difficulty, she denies any kind of CP, no palpitations or SOB.  She sleeps well most nights, rarely will use her PRN Xanax, no nighttime symptoms of PND or orthopnea.  No near syncope or syncope, infrequently when standing after bending forward will feel fleetingly lightheaded.  She noted a "tiny" amount of blood in her urine a few weeks ago, none since and no other bleeding or signs of bleeding.    Device information:  SJM dual chamber PPM, implanted 03/31/16, Dr. Graciela Husbands, tachy-brady   Past Medical History:  Diagnosis Date  . Atrial fibrillation and flutter (HCC)   . Essential hypertension 04/01/2016  . Lightheadedness/presyncope 04/01/2016    Past Surgical History:  Procedure Laterality Date  . PACEMAKER IMPLANT N/A 03/31/2016   Procedure: Pacemaker Implant;  Surgeon: Duke Salvia, MD;  Location: [facility]VASIVE CV LAB;  Service: Cardiovascular;   Laterality: N/A;    Current Outpatient Prescriptions  Medication Sig Dispense Refill  . ALPRAZolam (XANAX) 0.5 MG tablet Take 0.5 mg by mouth at bedtime as needed for anxiety.    Marland Kitchen atorvastatin (LIPITOR) 40 MG tablet Take 40 mg by mouth daily.    Marland Kitchen diltiazem (CARDIZEM CD) 240 MG 24 hr capsule Take 1 capsule (240 mg total) by mouth daily. 30 capsule 2  . magnesium hydroxide (MILK OF MAGNESIA) 400 MG/5ML suspension Take 30 mLs by mouth daily as needed for mild constipation.    . Multiple Vitamin (MULTIVITAMIN WITH MINERALS) TABS tablet Take 1 tablet by mouth daily.    . nitroGLYCERIN (NITROSTAT) 0.4 MG SL tablet Place 0.4 mg under the tongue every 5 (five) minutes as needed for chest pain.    . rivaroxaban (XARELTO) 20 MG TABS tablet Take 1 tablet (20 mg total) by mouth daily with supper. Start 04/03/16 30 tablet 1  . spironolactone (ALDACTONE) 25 MG tablet Take 1 tablet (25 mg total) by mouth daily. 30 tablet 2   No current facility-administered medications for this visit.     Allergies:   Patient has no known allergies.   Social History:  The patient  reports that she has quit smoking. Her smoking use included Cigarettes. She has quit using smokeless tobacco.   Family History:  The patient's family history includes Hypertension in her mother.  ROS:  Please see the history of present illness.  All other systems are reviewed and otherwise negative.   PHYSICAL EXAM:  VS:  BP (!) 144/84   Pulse 60   Ht  (1.6 m)   Wt 155 lb (70.3 kg)   BMI 27.46 kg/m  BMI: Body mass index is 27.46 kg/m. Well nourished, well developed, in no acute distress  HEENT: normocephalic, atraumatic  Neck: no JVD, carotid bruits or masses Cardiac:  RRR; no significant murmurs, no rubs, or gallops Lungs:  CTA b/l, no wheezing, rhonchi or rales  Abd: soft, nontender MS: no deformity or atrophy Ext: no edema  Skin: warm and dry, no rash Neuro:  No gross deficits appreciated Psych: euthymic mood, full  affect  PPM site is well healed, stable, no tethering or discomfort   EKG:  Done 04/01/16 was A paced/V sensed PPM check done today by industry and reviewed by myself: battery and lead measurements are stable, appears to be in a junctional rhythm this morning, stable A lead thresholds, P wave 1.33mv, NO AF episodes, AP 98%, <1% VP, HR histogram is flat and her sensor was re-programmed given she reported being very active. Acute lead settings were not changed yet.  03/31/16: TTE Study Conclusions - Left ventricle: The cavity size was normal. Wall thickness was   normal. Systolic function was vigorous. The estimated ejection   fraction was in the range of 70% to 75%. Wall motion was normal;   there were no regional wall motion abnormalities. Doppler   parameters are consistent with abnormal left ventricular   relaxation (grade 1 diastolic dysfunction). - Aortic valve: Valve area (VTI): 1.41 cm^2. Valve area (Vmax):   1.19 cm^2. Valve area (Vmean): 1.31 cm^2. - Left atrium: The atrium was normal in size. Volume/bsa, ES,   (1-plane Simpson&'s, A2C): 21.9 ml/m^2.   Recent Labs: 03/27/2016: ALT 17; B Natriuretic Peptide 134.7; Magnesium 2.2 03/30/2016: TSH 2.031 04/01/2016: Hemoglobin 13.2; Platelets 139 04/16/2016: BUN 17; Creatinine, Ser 0.98; Potassium 3.9; Sodium 140  03/28/2016: Cholesterol 231; HDL 58; LDL Cholesterol 162; Total CHOL/HDL Ratio 4.0; Triglycerides 54; VLDL 11   CrCl cannot be calculated (Patient's most recent lab result is older than the maximum 21 days allowed.).   Wt Readings from Last 3 Encounters:  05/14/16 155 lb (70.3 kg)  04/01/16 152 lb 12.8 oz (69.3 kg)     Other studies reviewed: Additional studies/records reviewed today include: summarized above  ASSESSMENT AND PLAN:  1. Tacy-brady w/PPM     Site is well healed without complication  2. Paroxysmal Afib     CHA2DS2Vasc is 5, on xarelto     No AF noted on her device, continue with CCB     Will get CBC today  given new to a/c  3. HTN     follow without changes today  4. CAD     no symptoms     On statin, no ASA with OAC     f/u with Dr. Elease Hashimoto      She was uncertain on her medicines, though was taking them as instructed except for her lipitor, will refill this for her.   Disposition: F/u with Dr. Graciela Husbands in 2 months for post implant/device check as usual, recommend she remain established with general cardiology Dr. Elease Hashimoto as well.   Current medicines are reviewed at length with the patient today.  The patient did not have any concerns regarding medicines.  Judith Blonder, PA-C 05/14/2016 9:42 AM     [facility] [address] [place] [address] [phone] (office)  [phone] (fax)

## 2016-05-14 ENCOUNTER — Encounter (INDEPENDENT_AMBULATORY_CARE_PROVIDER_SITE_OTHER): Payer: Self-pay

## 2016-05-14 ENCOUNTER — Ambulatory Visit (INDEPENDENT_AMBULATORY_CARE_PROVIDER_SITE_OTHER): Payer: Medicare HMO | Admitting: Physician Assistant

## 2016-05-14 VITALS — BP 144/84 | HR 60 | Ht 63.0 in | Wt 155.0 lb

## 2016-05-14 DIAGNOSIS — Z79899 Other long term (current) drug therapy: Secondary | ICD-10-CM

## 2016-05-14 DIAGNOSIS — Z950 Presence of cardiac pacemaker: Secondary | ICD-10-CM | POA: Diagnosis not present

## 2016-05-14 DIAGNOSIS — I2510 Atherosclerotic heart disease of native coronary artery without angina pectoris: Secondary | ICD-10-CM | POA: Diagnosis not present

## 2016-05-14 DIAGNOSIS — I480 Paroxysmal atrial fibrillation: Secondary | ICD-10-CM | POA: Diagnosis not present

## 2016-05-14 DIAGNOSIS — I10 Essential (primary) hypertension: Secondary | ICD-10-CM | POA: Diagnosis not present

## 2016-05-14 LAB — CBC
Hematocrit: 40.6 % (ref 34.0–46.6)
Hemoglobin: 13.6 g/dL (ref 11.1–15.9)
MCH: 27.7 pg (ref 26.6–33.0)
MCHC: 33.5 g/dL (ref 31.5–35.7)
MCV: 83 fL (ref 79–97)
PLATELETS: 212 10*3/uL (ref 150–379)
RBC: 4.91 x10E6/uL (ref 3.77–5.28)
RDW: 14 % (ref 12.3–15.4)
WBC: 6.9 10*3/uL (ref 3.4–10.8)

## 2016-05-14 MED ORDER — ATORVASTATIN CALCIUM 40 MG PO TABS: 40.0000 mg | ORAL_TABLET | Freq: Every day | ORAL | 6 refills | Status: DC

## 2016-05-14 NOTE — Patient Instructions (Addendum)
Medication Instructions:   Your physician recommends that you continue on your current medications as directed. Please refer to the Current Medication list given to you today.   If you need a refill on your cardiac medications before your next appointment, please call your pharmacy.  Labwork: NONE ORDERED  TODAY    Testing/Procedures:  NONE ORDERED  TODAY     Follow-Up:  IN 2 MONTHS WITH KLEIN POST IMPLANT  Your physician wants you to follow-up in:  IN  6  MONTHS WITH DR Elease Hashimoto  You will receive a reminder letter in the mail two months in advance. If you don't receive a letter, please call our office to schedule the follow-up appointment.        Any Other Special Instructions Will Be Listed Below (If Applicable).

## 2016-05-14 NOTE — Addendum Note (Signed)
Addended by: Oleta Mouse on: 05/14/2016 03:40 PM   Modules accepted: Orders

## 2016-06-11 ENCOUNTER — Other Ambulatory Visit: Payer: Self-pay | Admitting: Internal Medicine

## 2016-06-11 DIAGNOSIS — Z1231 Encounter for screening mammogram for malignant neoplasm of breast: Secondary | ICD-10-CM

## 2016-06-12 ENCOUNTER — Ambulatory Visit
Admission: RE | Admit: 2016-06-12 | Discharge: 2016-06-12 | Disposition: A | Discharge status: Home / Self Care | Payer: Medicare HMO | Source: Ambulatory Visit | Attending: Internal Medicine | Admitting: Internal Medicine

## 2016-06-12 DIAGNOSIS — Z1231 Encounter for screening mammogram for malignant neoplasm of breast: Secondary | ICD-10-CM

## 2016-06-19 ENCOUNTER — Other Ambulatory Visit: Payer: Self-pay | Admitting: Nurse Practitioner

## 2016-08-06 ENCOUNTER — Encounter: Payer: Self-pay | Admitting: Internal Medicine

## 2016-08-06 ENCOUNTER — Ambulatory Visit (INDEPENDENT_AMBULATORY_CARE_PROVIDER_SITE_OTHER): Payer: Medicare HMO | Admitting: Internal Medicine

## 2016-08-06 VITALS — BP 116/74 | HR 71 | Ht 63.0 in | Wt 155.0 lb

## 2016-08-06 DIAGNOSIS — I2510 Atherosclerotic heart disease of native coronary artery without angina pectoris: Secondary | ICD-10-CM | POA: Diagnosis not present

## 2016-08-06 DIAGNOSIS — I480 Paroxysmal atrial fibrillation: Secondary | ICD-10-CM | POA: Diagnosis not present

## 2016-08-06 DIAGNOSIS — I495 Sick sinus syndrome: Secondary | ICD-10-CM

## 2016-08-06 DIAGNOSIS — Z950 Presence of cardiac pacemaker: Secondary | ICD-10-CM

## 2016-08-06 DIAGNOSIS — I951 Orthostatic hypotension: Secondary | ICD-10-CM | POA: Diagnosis not present

## 2016-08-06 MED ORDER — DILTIAZEM HCL ER COATED BEADS 120 MG PO CP24: 120.0000 mg | ORAL_CAPSULE | Freq: Every day | ORAL | 9 refills | Status: DC

## 2016-08-06 NOTE — Patient Instructions (Addendum)
Medication Instructions: Your physician has recommended you make the following change in your medication:  -1) DECREASE Diltiazem - Take 1 tablet (120 mg) by mouth daily  Labwork: - Your physician recommends that you have lab work today: BMP/ LDL  Procedures/Testing: None Ordered  Follow-Up: Your physician wants you to follow-up in: 9 MONTHS with Dr. Graciela HusbandsKlein.  You will receive a reminder letter in the mail two months in advance. If you don't receive a letter, please call our office to schedule the follow-up appointment.  Remote monitoring is used to monitor your Pacemaker from home. This monitoring reduces the number of office visits required to check your device to one time per year. It allows us to keep an eye on the functioning of your device to ensure it is working properly. You are scheduled for a device check from home on 11/05/16. You may send your transmission at any time that day. If you have a wireless device, the transmission will be sent automatically. After your physician reviews your transmission, you will receive a postcard with your next transmission date.      If you need a refill on your cardiac medications before your next appointment, please call your pharmacy.

## 2016-08-06 NOTE — Progress Notes (Signed)
      Patient Care Team: Patient, No Pcp Per as PCP - General (General Practice)   HPI  Janet Everett is a 81 y.o. female Sen in followup for Afib and post termination pauses and sinus node dysfunction for which she underwent pacing 3/`18  She has hx of CAD with prior stenting, details not available.  She has hypertension   She's recently having problems with orthostatic lightheadedness. No syncope  No interval palpitations. No chest pain or sob  Date Cr Hgb  3/18 0.98 13.6         Past Medical History:  Diagnosis Date  . Atrial fibrillation and flutter (HCC)   . Essential hypertension 04/01/2016  . Lightheadedness/presyncope 04/01/2016    Past Surgical History:  Procedure Laterality Date  . BREAST CYST ASPIRATION Right age 81  . BREAST EXCISIONAL BIOPSY Right 2011  . PACEMAKER IMPLANT N/A 03/31/2016   Procedure: Pacemaker Implant;  Surgeon: Duke SalviaSteven C Klein, MD;  [facility];  Service: Cardiovascular;  Laterality: N/A;    Current Outpatient Prescriptions  Medication Sig Dispense Refill  . ALPRAZolam (XANAX) 0.5 MG tablet Take 0.5 mg by mouth at bedtime as needed for anxiety.    Marland Kitchen. atorvastatin (LIPITOR) 40 MG tablet Take 1 tablet (40 mg total) by mouth daily. 30 tablet 6  . diltiazem (CARDIZEM CD) 240 MG 24 hr capsule Take 1 capsule (240 mg total) by mouth daily. 30 capsule 2  . magnesium hydroxide (MILK OF MAGNESIA) 400 MG/5ML suspension Take 30 mLs by mouth daily as needed for mild constipation.    . Multiple Vitamin (MULTIVITAMIN WITH MINERALS) TABS tablet Take 1 tablet by mouth daily.    . nitroGLYCERIN (NITROSTAT) 0.4 MG SL tablet Place 0.4 mg under the tongue every 5 (five) minutes as needed for chest pain.    Marland Kitchen. spironolactone (ALDACTONE) 25 MG tablet TAKE 1 TABLET BY MOUTH DAILY 30 tablet 0  . XARELTO 20 MG TABS tablet TAKE 1 TABLET BY MOUTH DAILY WITH SUPPER 30 tablet 0   No current facility-administered medications for this visit.     No  Known Allergies    Review of Systems negative except from HPI and PMH  Physical Exam BP 116/74   Pulse 71   Ht 5\' 3"  (1.6 m)   Wt 155 lb (70.3 kg)   SpO2 99%   BMI 27.46 kg/m  Well developed and well nourished in no acute distress HENT normal E scleral and icterus clear Neck Supple JVP flat; carotids brisk and full Clear to ausculation Device pocket well healed; without hematoma or erythema.  There is no tethering  Regular rate and rhythm, no murmurs gallops or rub Soft with active bowel sounds No clubbing cyanosis  Edema Alert and oriented, grossly normal motor and sensory function Skin Warm and Dry  ECG demonstrates atrial paced rhythm at 64 Intervals 18/08/41 LVH with repolarization abnormalities  Assessment and  Plan Atrial fibrillation-paroxysmal Sinus node dysfunction  Orthostatic lightheadedness  Hypertension  Hyperlipidemia  No intercurrent atrial fibrillation or flutter  On Anticoagulation;  No bleeding issues   Will decrease her dilt 240>>120 2/2 orthostatic lightheadedness   Will need recheck of her LDL     Current medicines are reviewed at length with the patient today .  The patient does not have concerns regarding medicines.

## 2016-08-25 ENCOUNTER — Other Ambulatory Visit: Payer: Self-pay | Admitting: General Surgery

## 2016-09-05 ENCOUNTER — Other Ambulatory Visit: Payer: Self-pay | Admitting: Internal Medicine

## 2016-09-05 DIAGNOSIS — N63 Unspecified lump in unspecified breast: Secondary | ICD-10-CM

## 2016-09-15 ENCOUNTER — Ambulatory Visit
Admission: RE | Admit: 2016-09-15 | Discharge: 2016-09-15 | Disposition: A | Discharge status: Home / Self Care | Payer: Medicare HMO | Source: Ambulatory Visit | Attending: Internal Medicine | Admitting: Internal Medicine

## 2016-09-15 DIAGNOSIS — N63 Unspecified lump in unspecified breast: Secondary | ICD-10-CM

## 2016-11-05 ENCOUNTER — Ambulatory Visit (INDEPENDENT_AMBULATORY_CARE_PROVIDER_SITE_OTHER): Payer: Medicare HMO | Admitting: *Deleted

## 2016-11-05 ENCOUNTER — Telehealth: Payer: Self-pay | Admitting: Cardiology

## 2016-11-05 DIAGNOSIS — I495 Sick sinus syndrome: Secondary | ICD-10-CM

## 2016-11-05 NOTE — Telephone Encounter (Signed)
Spoke with pt and reminded pt of remote transmission that is due today. Pt verbalized understanding.   

## 2016-11-06 NOTE — Progress Notes (Signed)
Remote pacemaker transmission.   

## 2016-11-07 ENCOUNTER — Encounter: Payer: Self-pay | Admitting: Cardiology

## 2016-11-07 NOTE — Progress Notes (Signed)
Letter  

## 2016-11-28 LAB — CUP PACEART REMOTE DEVICE CHECK
Battery Remaining Longevity: 114 mo
Brady Statistic AP VP Percent: 1 %
Brady Statistic AP VS Percent: 97 %
Brady Statistic RV Percent Paced: 1 %
Implantable Lead Implant Date: 20180312
Implantable Lead Implant Date: 20180312
Implantable Lead Location: 753859
Implantable Lead Location: 753860
Lead Channel Impedance Value: 650 Ohm
Lead Channel Pacing Threshold Amplitude: 0.75 V
Lead Channel Pacing Threshold Pulse Width: 0.5 ms
Lead Channel Pacing Threshold Pulse Width: 0.5 ms
Lead Channel Sensing Intrinsic Amplitude: 12 mV
Lead Channel Setting Pacing Amplitude: 1 V
Lead Channel Setting Pacing Pulse Width: 0.5 ms
Lead Channel Setting Sensing Sensitivity: 2 mV
MDC IDC MSMT BATTERY REMAINING PERCENTAGE: 95.5 %
MDC IDC MSMT BATTERY VOLTAGE: 2.99 V
MDC IDC MSMT LEADCHNL RA IMPEDANCE VALUE: 460 Ohm
MDC IDC MSMT LEADCHNL RA PACING THRESHOLD AMPLITUDE: 0.875 V
MDC IDC MSMT LEADCHNL RA SENSING INTR AMPL: 4.2 mV
MDC IDC PG IMPLANT DT: 20180312
MDC IDC PG SERIAL: 8005635
MDC IDC SESS DTM: 20181017131805
MDC IDC SET LEADCHNL RA PACING AMPLITUDE: 2 V
MDC IDC STAT BRADY AS VP PERCENT: 1 %
MDC IDC STAT BRADY AS VS PERCENT: 2.8 %
MDC IDC STAT BRADY RA PERCENT PACED: 97 %

## 2016-12-01 ENCOUNTER — Other Ambulatory Visit: Payer: Self-pay | Admitting: Physician Assistant

## 2017-02-04 ENCOUNTER — Ambulatory Visit (INDEPENDENT_AMBULATORY_CARE_PROVIDER_SITE_OTHER): Payer: Medicare HMO | Admitting: *Deleted

## 2017-02-04 ENCOUNTER — Telehealth: Payer: Self-pay | Admitting: Cardiology

## 2017-02-04 DIAGNOSIS — I495 Sick sinus syndrome: Secondary | ICD-10-CM | POA: Diagnosis not present

## 2017-02-04 NOTE — Telephone Encounter (Signed)
Spoke with pt and reminded pt of remote transmission that is due today. Pt verbalized understanding.   

## 2017-02-05 ENCOUNTER — Telehealth: Payer: Self-pay | Admitting: Cardiology

## 2017-02-05 NOTE — Telephone Encounter (Signed)
LVM confirming that we successfully received remote transmission.

## 2017-02-05 NOTE — Progress Notes (Signed)
Remote pacemaker transmission.   

## 2017-02-05 NOTE — Telephone Encounter (Signed)
New Message:   She wants to know if you received her transmission?

## 2017-02-06 ENCOUNTER — Encounter: Payer: Self-pay | Admitting: Cardiology

## 2017-02-10 LAB — CUP PACEART REMOTE DEVICE CHECK
Battery Voltage: 2.99 V
Brady Statistic AP VP Percent: 1 %
Brady Statistic AS VP Percent: 1 %
Brady Statistic AS VS Percent: 9.8 %
Implantable Lead Implant Date: 20180312
Implantable Lead Location: 753859
Implantable Pulse Generator Implant Date: 20180312
Lead Channel Impedance Value: 650 Ohm
Lead Channel Pacing Threshold Amplitude: 0.5 V
Lead Channel Pacing Threshold Pulse Width: 0.5 ms
Lead Channel Sensing Intrinsic Amplitude: 12 mV
Lead Channel Setting Pacing Amplitude: 0.875
Lead Channel Setting Pacing Amplitude: 2 V
Lead Channel Setting Sensing Sensitivity: 2 mV
MDC IDC LEAD IMPLANT DT: 20180312
MDC IDC LEAD LOCATION: 753860
MDC IDC MSMT BATTERY REMAINING LONGEVITY: 114 mo
MDC IDC MSMT BATTERY REMAINING PERCENTAGE: 95.5 %
MDC IDC MSMT LEADCHNL RA IMPEDANCE VALUE: 480 Ohm
MDC IDC MSMT LEADCHNL RA PACING THRESHOLD PULSEWIDTH: 0.5 ms
MDC IDC MSMT LEADCHNL RA SENSING INTR AMPL: 3.8 mV
MDC IDC MSMT LEADCHNL RV PACING THRESHOLD AMPLITUDE: 0.625 V
MDC IDC PG SERIAL: 8005635
MDC IDC SESS DTM: 20190117063351
MDC IDC SET LEADCHNL RV PACING PULSEWIDTH: 0.5 ms
MDC IDC STAT BRADY AP VS PERCENT: 90 %
MDC IDC STAT BRADY RA PERCENT PACED: 89 %
MDC IDC STAT BRADY RV PERCENT PACED: 1 %

## 2017-05-06 ENCOUNTER — Ambulatory Visit (INDEPENDENT_AMBULATORY_CARE_PROVIDER_SITE_OTHER): Payer: Medicare HMO | Admitting: *Deleted

## 2017-05-06 ENCOUNTER — Telehealth: Payer: Self-pay | Admitting: Cardiology

## 2017-05-06 DIAGNOSIS — I495 Sick sinus syndrome: Secondary | ICD-10-CM | POA: Diagnosis not present

## 2017-05-06 NOTE — Telephone Encounter (Signed)
Spoke with pt and reminded pt of remote transmission that is due today. Pt verbalized understanding.   

## 2017-05-07 ENCOUNTER — Encounter: Payer: Self-pay | Admitting: Cardiology

## 2017-05-07 NOTE — Progress Notes (Signed)
Letter  

## 2017-05-07 NOTE — Progress Notes (Signed)
Remote pacemaker transmission.   

## 2017-05-12 LAB — CUP PACEART REMOTE DEVICE CHECK
Brady Statistic AP VP Percent: 1 %
Brady Statistic AP VS Percent: 92 %
Brady Statistic AS VS Percent: 7.8 %
Brady Statistic RV Percent Paced: 1 %
Date Time Interrogation Session: 20190417161515
Implantable Lead Implant Date: 20180312
Implantable Lead Implant Date: 20180312
Implantable Lead Location: 753860
Implantable Pulse Generator Implant Date: 20180312
Lead Channel Impedance Value: 630 Ohm
Lead Channel Pacing Threshold Amplitude: 0.625 V
Lead Channel Pacing Threshold Pulse Width: 0.5 ms
Lead Channel Sensing Intrinsic Amplitude: 12 mV
Lead Channel Setting Pacing Amplitude: 0.875
Lead Channel Setting Pacing Pulse Width: 0.5 ms
Lead Channel Setting Sensing Sensitivity: 2 mV
MDC IDC LEAD LOCATION: 753859
MDC IDC MSMT BATTERY REMAINING LONGEVITY: 120 mo
MDC IDC MSMT BATTERY REMAINING PERCENTAGE: 95.5 %
MDC IDC MSMT BATTERY VOLTAGE: 2.99 V
MDC IDC MSMT LEADCHNL RA IMPEDANCE VALUE: 480 Ohm
MDC IDC MSMT LEADCHNL RA PACING THRESHOLD AMPLITUDE: 0.625 V
MDC IDC MSMT LEADCHNL RA PACING THRESHOLD PULSEWIDTH: 0.5 ms
MDC IDC MSMT LEADCHNL RA SENSING INTR AMPL: 4.9 mV
MDC IDC PG SERIAL: 8005635
MDC IDC SET LEADCHNL RA PACING AMPLITUDE: 2 V
MDC IDC STAT BRADY AS VP PERCENT: 1 %
MDC IDC STAT BRADY RA PERCENT PACED: 92 %

## 2017-05-13 ENCOUNTER — Ambulatory Visit: Payer: Medicare HMO | Admitting: Internal Medicine

## 2017-05-13 ENCOUNTER — Encounter: Payer: Self-pay | Admitting: Internal Medicine

## 2017-05-13 ENCOUNTER — Ambulatory Visit (INDEPENDENT_AMBULATORY_CARE_PROVIDER_SITE_OTHER)
Admission: RE | Admit: 2017-05-13 | Discharge: 2017-05-13 | Disposition: A | Discharge status: Home / Self Care | Payer: Medicare HMO | Source: Ambulatory Visit | Attending: Internal Medicine | Admitting: Internal Medicine

## 2017-05-13 VITALS — BP 108/74 | HR 67 | Ht 63.0 in | Wt 151.0 lb

## 2017-05-13 DIAGNOSIS — Z950 Presence of cardiac pacemaker: Secondary | ICD-10-CM | POA: Diagnosis not present

## 2017-05-13 DIAGNOSIS — I495 Sick sinus syndrome: Secondary | ICD-10-CM | POA: Diagnosis not present

## 2017-05-13 DIAGNOSIS — G4489 Other headache syndrome: Secondary | ICD-10-CM

## 2017-05-13 DIAGNOSIS — I480 Paroxysmal atrial fibrillation: Secondary | ICD-10-CM

## 2017-05-13 MED ORDER — FUROSEMIDE 20 MG PO TABS: 20.0000 mg | ORAL_TABLET | ORAL | 0 refills | Status: DC

## 2017-05-13 NOTE — Patient Instructions (Signed)
Medication Instructions:  Your physician recommends that you continue on your current medications as directed. Please refer to the Current Medication list given to you today.  Labwork: You will have labs drawn today: CBC, BMP  Testing/Procedures: Non-Cardiac CT scanning, (CAT scanning), is a noninvasive, special x-ray that produces cross-sectional images of the body using x-rays and a computer. CT scans help physicians diagnose and treat medical conditions. For some CT exams, a contrast material is used to enhance visibility in the area of the body being studied. CT scans provide greater clarity and reveal more details than regular x-ray exams.   Follow-Up: Your physician wants you to follow-up in: 6 months with Janet Everett. You will receive a reminder letter in the mail two months in advance. If you don't receive a letter, please call our office to schedule the follow-up appointment.  Remote monitoring is used to monitor your Pacemaker of ICD from home. This monitoring reduces the number of office visits required to check your device to one time per year. It allows us to keep an eye on the functioning of your device to ensure it is working properly. You are scheduled for a device check from home on 08/05/2017. You may send your transmission at any time that day. If you have a wireless device, the transmission will be sent automatically. After your physician reviews your transmission, you will receive a postcard with your next transmission date.   Any Other Special Instructions Will Be Listed Below (If Applicable).     If you need a refill on your cardiac medications before your next appointment, please call your pharmacy.

## 2017-05-13 NOTE — Progress Notes (Signed)
Patient Care Team: Patient, No Pcp Per as PCP - General (General Practice)   HPI  Janet Everett is a 82 y.o. female Sen in followup for Afib and post termination pauses and sinus node dysfunction for which she underwent pacing 3/`18  She has hx of CAD with prior stenting. Has Hypertension  Anticoagulated with her Xarelto  Date Cr Hgb  3/18 0.98 13.6  11/18 0.79 13.2        No bleeding, but has had problems with headaches dating back to January.  They become increasingly frequent and more likely to be present upon awakening.  They are not lateralizing.  Increasing problems with shortness of breath of late.  This is accompanied by peripheral edema.  No abdominal distention.  No chest pain.       Past Medical History:  Diagnosis Date  . Atrial fibrillation and flutter (HCC)   . Essential hypertension 04/01/2016  . Lightheadedness/presyncope 04/01/2016    Past Surgical History:  Procedure Laterality Date  . BREAST CYST ASPIRATION Right age 82  . BREAST CYST EXCISION Right    age 82 and 512010  . BREAST EXCISIONAL BIOPSY Right 2011  . PACEMAKER IMPLANT N/A 03/31/2016   Procedure: Pacemaker Implant;  Surgeon: Duke SalviaSteven C Vylette Strubel, MD;  [facility];  Service: Cardiovascular;  Laterality: N/A;    Current Outpatient Medications  Medication Sig Dispense Refill  . ALPRAZolam (XANAX) 0.5 MG tablet Take 0.5 mg by mouth at bedtime as needed for anxiety.    Marland Kitchen. atorvastatin (LIPITOR) 40 MG tablet TAKE 1 TABLET(40 MG) BY MOUTH DAILY 30 tablet 6  . diltiazem (CARDIZEM CD) 120 MG 24 hr capsule Take 1 capsule (120 mg total) by mouth daily. 30 capsule 9  . magnesium hydroxide (MILK OF MAGNESIA) 400 MG/5ML suspension Take 30 mLs by mouth daily as needed for mild constipation.    . Multiple Vitamin (MULTIVITAMIN WITH MINERALS) TABS tablet Take 1 tablet by mouth daily.    . nitroGLYCERIN (NITROSTAT) 0.4 MG SL tablet Place 0.4 mg under the tongue every 5 (five) minutes as  needed for chest pain.    Marland Kitchen. spironolactone (ALDACTONE) 25 MG tablet TAKE 1 TABLET BY MOUTH DAILY 30 tablet 0  . XARELTO 20 MG TABS tablet TAKE 1 TABLET BY MOUTH DAILY WITH SUPPER 30 tablet 0   No current facility-administered medications for this visit.     No Known Allergies    Review of Systems negative except from HPI and PMH  Physical Exam BP 108/74   Pulse 67   Ht 5\' 3"  (1.6 m)   Wt 151 lb (68.5 kg)   SpO2 99%   BMI 26.75 kg/m  Well developed and nourished in no acute distress HENT normal Neck supple with JVP-7-8 Denies clear Regular rate and rhythm, no murmurs or gallops Abd-soft with active BS No Clubbing cyanosis tr edema Skin-warm and dry A & Oriented  Grossly normal sensory and motor function   ECG demonstrates  Sinus with LVH and repol 15/08/41  Assessment and  Plan Atrial fibrillation-paroxysmal  Sinus node dysfunction  Orthostatic lightheadedness  Hypertension  Acute/chronic CHF diastolic  Hyperlipidemia  HeadAche on anticoagulation  Blood pressure reasonably controlled  With her headache, we will undertake a CT scan to exclude a bleed.  This is particularly concerning given the fact that she is having a.m. worsening headaches  On Anticoagulation;  No bleeding issues   Check blood work on anticoagulation and K on aldactone  With  her shortness of breath and mild edema, we will add a low-dose loop diuretic  We spent more than 50% of our >25 min visit in face to face counseling regarding the above     Current medicines are reviewed at length with the patient today .  The patient does not have concerns regarding medicines.

## 2017-05-14 ENCOUNTER — Other Ambulatory Visit: Payer: Medicare HMO

## 2017-05-15 LAB — BASIC METABOLIC PANEL
BUN / CREAT RATIO: 14 (ref 12–28)
BUN: 16 mg/dL (ref 8–27)
CO2: 21 mmol/L (ref 20–29)
CREATININE: 1.15 mg/dL — AB (ref 0.57–1.00)
Calcium: 11.3 mg/dL — ABNORMAL HIGH (ref 8.7–10.3)
Chloride: 104 mmol/L (ref 96–106)
GFR calc non Af Amer: 45 mL/min/{1.73_m2} — ABNORMAL LOW (ref >59)
GFR, EST AFRICAN AMERICAN: 52 mL/min/{1.73_m2} — AB (ref >59)
GLUCOSE: 94 mg/dL (ref 65–99)
Potassium: 4.9 mmol/L (ref 3.5–5.2)
SODIUM: 141 mmol/L (ref 134–144)

## 2017-05-15 LAB — CBC WITH DIFFERENTIAL/PLATELET
BASOS: 0 %
Basophils Absolute: 0 10*3/uL (ref 0.0–0.2)
EOS (ABSOLUTE): 0.2 10*3/uL (ref 0.0–0.4)
EOS: 2 %
HEMATOCRIT: 39.3 % (ref 34.0–46.6)
Hemoglobin: 13 g/dL (ref 11.1–15.9)
Immature Grans (Abs): 0 10*3/uL (ref 0.0–0.1)
Immature Granulocytes: 0 %
LYMPHS ABS: 2 10*3/uL (ref 0.7–3.1)
Lymphs: 28 %
MCH: 28.1 pg (ref 26.6–33.0)
MCHC: 33.1 g/dL (ref 31.5–35.7)
MCV: 85 fL (ref 79–97)
Monocytes Absolute: 0.8 10*3/uL (ref 0.1–0.9)
Monocytes: 11 %
NEUTROS ABS: 4.2 10*3/uL (ref 1.4–7.0)
Neutrophils: 59 %
Platelets: 238 10*3/uL (ref 150–379)
RBC: 4.63 x10E6/uL (ref 3.77–5.28)
RDW: 14.1 % (ref 12.3–15.4)
WBC: 7.2 10*3/uL (ref 3.4–10.8)

## 2017-05-19 ENCOUNTER — Telehealth: Payer: Self-pay

## 2017-05-19 LAB — CUP PACEART INCLINIC DEVICE CHECK
Battery Remaining Longevity: 117 mo
Brady Statistic RA Percent Paced: 92 %
Date Time Interrogation Session: 20190424184547
Implantable Lead Implant Date: 20180312
Implantable Lead Location: 753859
Lead Channel Pacing Threshold Amplitude: 0.5 V
Lead Channel Pacing Threshold Pulse Width: 0.5 ms
Lead Channel Pacing Threshold Pulse Width: 0.5 ms
Lead Channel Pacing Threshold Pulse Width: 0.5 ms
Lead Channel Pacing Threshold Pulse Width: 0.5 ms
Lead Channel Sensing Intrinsic Amplitude: 12 mV
MDC IDC LEAD IMPLANT DT: 20180312
MDC IDC LEAD LOCATION: 753860
MDC IDC MSMT BATTERY VOLTAGE: 2.99 V
MDC IDC MSMT LEADCHNL RA IMPEDANCE VALUE: 475 Ohm
MDC IDC MSMT LEADCHNL RA PACING THRESHOLD AMPLITUDE: 0.5 V
MDC IDC MSMT LEADCHNL RA SENSING INTR AMPL: 4 mV
MDC IDC MSMT LEADCHNL RV IMPEDANCE VALUE: 650 Ohm
MDC IDC MSMT LEADCHNL RV PACING THRESHOLD AMPLITUDE: 0.75 V
MDC IDC MSMT LEADCHNL RV PACING THRESHOLD AMPLITUDE: 0.75 V
MDC IDC PG IMPLANT DT: 20180312
MDC IDC SET LEADCHNL RA PACING AMPLITUDE: 2 V
MDC IDC SET LEADCHNL RV PACING AMPLITUDE: 0.875
MDC IDC SET LEADCHNL RV PACING PULSEWIDTH: 0.5 ms
MDC IDC SET LEADCHNL RV SENSING SENSITIVITY: 2 mV
MDC IDC STAT BRADY RV PERCENT PACED: 0.36 %
Pulse Gen Model: 2272
Pulse Gen Serial Number: 8005635

## 2017-05-19 NOTE — Telephone Encounter (Signed)
Spoke with pt. She will be in the office tomorrow for ionized calcium tomorrow. Her PCP is Dr Kathrynn Speed @ [facility], [facility] location.

## 2017-05-20 ENCOUNTER — Other Ambulatory Visit: Payer: Medicare HMO | Admitting: *Deleted

## 2017-05-21 LAB — CALCIUM, IONIZED: Calcium, Ion: 6 mg/dL — ABNORMAL HIGH (ref 4.5–5.6)

## 2017-06-09 ENCOUNTER — Other Ambulatory Visit: Payer: Self-pay | Admitting: Internal Medicine

## 2017-06-23 ENCOUNTER — Other Ambulatory Visit: Payer: Self-pay | Admitting: Physician Assistant

## 2017-08-05 ENCOUNTER — Ambulatory Visit (INDEPENDENT_AMBULATORY_CARE_PROVIDER_SITE_OTHER): Payer: Medicare HMO | Admitting: *Deleted

## 2017-08-05 DIAGNOSIS — I495 Sick sinus syndrome: Secondary | ICD-10-CM

## 2017-08-05 NOTE — Progress Notes (Signed)
Remote pacemaker transmission.   

## 2017-08-06 ENCOUNTER — Encounter: Payer: Self-pay | Admitting: Cardiology

## 2017-08-06 ENCOUNTER — Telehealth: Payer: Self-pay

## 2017-08-06 ENCOUNTER — Other Ambulatory Visit: Payer: Self-pay | Admitting: Internal Medicine

## 2017-08-06 MED ORDER — FUROSEMIDE 20 MG PO TABS: 20.0000 mg | ORAL_TABLET | ORAL | 3 refills | Status: AC | PRN

## 2017-08-06 NOTE — Telephone Encounter (Signed)
Patient saw Dr. Graciela HusbandsKlein in 04/2017 and Furosemide was given every other day for 10 days. There was 30 tablets ordered with no refills and a stop date was put in for 05/2017. Does patient need to continue this medication?

## 2017-08-06 NOTE — Telephone Encounter (Signed)
Spoke with pt today to assess how much lasix she has been needing/taking. She states she only takes 20mg  about once every 2 weeks when she feels swollen. During her last OV, Dr Graciela HusbandsKlein advised her to take one PRN after she was diuresed. I advised pt that if she needs to take her lasix often, she should call the office and let us know. We may want to have a BMP drawn to assess kidney and K+. Pt also stated she has leg cramps when she takes her lasix. I have advised her to seek a low dose potassium supplement to take when she takes her lasix. If her symptoms do not resolve with this, to call us back for further assessment. Pt agreed to plan and had no additional questions.

## 2017-08-16 LAB — CUP PACEART REMOTE DEVICE CHECK
Battery Voltage: 2.99 V
Brady Statistic AP VP Percent: 1 %
Brady Statistic AS VP Percent: 1 %
Brady Statistic AS VS Percent: 6.5 %
Brady Statistic RV Percent Paced: 1 %
Implantable Lead Implant Date: 20180312
Implantable Lead Location: 753860
Implantable Pulse Generator Implant Date: 20180312
Lead Channel Impedance Value: 600 Ohm
Lead Channel Pacing Threshold Pulse Width: 0.5 ms
Lead Channel Sensing Intrinsic Amplitude: 12 mV
Lead Channel Setting Pacing Amplitude: 1 V
Lead Channel Setting Pacing Amplitude: 2 V
Lead Channel Setting Pacing Pulse Width: 0.5 ms
Lead Channel Setting Sensing Sensitivity: 2 mV
MDC IDC LEAD IMPLANT DT: 20180312
MDC IDC LEAD LOCATION: 753859
MDC IDC MSMT BATTERY REMAINING LONGEVITY: 120 mo
MDC IDC MSMT BATTERY REMAINING PERCENTAGE: 95.5 %
MDC IDC MSMT LEADCHNL RA IMPEDANCE VALUE: 480 Ohm
MDC IDC MSMT LEADCHNL RA PACING THRESHOLD AMPLITUDE: 0.875 V
MDC IDC MSMT LEADCHNL RA PACING THRESHOLD PULSEWIDTH: 0.5 ms
MDC IDC MSMT LEADCHNL RA SENSING INTR AMPL: 3.8 mV
MDC IDC MSMT LEADCHNL RV PACING THRESHOLD AMPLITUDE: 0.75 V
MDC IDC SESS DTM: 20190717080013
MDC IDC STAT BRADY AP VS PERCENT: 93 %
MDC IDC STAT BRADY RA PERCENT PACED: 93 %
Pulse Gen Serial Number: 8005635

## 2017-10-30 ENCOUNTER — Encounter: Payer: Self-pay | Admitting: Nurse Practitioner

## 2017-11-04 ENCOUNTER — Ambulatory Visit (INDEPENDENT_AMBULATORY_CARE_PROVIDER_SITE_OTHER): Payer: Medicare HMO | Admitting: *Deleted

## 2017-11-04 DIAGNOSIS — I495 Sick sinus syndrome: Secondary | ICD-10-CM

## 2017-11-04 DIAGNOSIS — I480 Paroxysmal atrial fibrillation: Secondary | ICD-10-CM

## 2017-11-04 NOTE — Progress Notes (Signed)
Remote pacemaker transmission.   

## 2017-11-06 ENCOUNTER — Encounter: Payer: Self-pay | Admitting: Cardiology

## 2017-11-18 ENCOUNTER — Encounter: Payer: Self-pay | Admitting: Nurse Practitioner

## 2017-11-18 ENCOUNTER — Ambulatory Visit: Payer: Medicare HMO | Admitting: Nurse Practitioner

## 2017-11-18 VITALS — BP 118/70 | HR 87 | Ht 63.0 in | Wt 158.0 lb

## 2017-11-18 DIAGNOSIS — R001 Bradycardia, unspecified: Secondary | ICD-10-CM

## 2017-11-18 DIAGNOSIS — I5032 Chronic diastolic (congestive) heart failure: Secondary | ICD-10-CM | POA: Diagnosis not present

## 2017-11-18 DIAGNOSIS — Z950 Presence of cardiac pacemaker: Secondary | ICD-10-CM | POA: Insufficient documentation

## 2017-11-18 DIAGNOSIS — I480 Paroxysmal atrial fibrillation: Secondary | ICD-10-CM | POA: Diagnosis not present

## 2017-11-18 DIAGNOSIS — I10 Essential (primary) hypertension: Secondary | ICD-10-CM

## 2017-11-18 LAB — CUP PACEART INCLINIC DEVICE CHECK
Implantable Lead Implant Date: 20180312
Implantable Lead Location: 753860
Implantable Pulse Generator Implant Date: 20180312
MDC IDC LEAD IMPLANT DT: 20180312
MDC IDC LEAD LOCATION: 753859
MDC IDC SESS DTM: 20191030113830
Pulse Gen Model: 2272
Pulse Gen Serial Number: 8005635

## 2017-11-18 NOTE — Patient Instructions (Signed)
Medication Instructions:  none If you need a refill on your cardiac medications before your next appointment, please call your pharmacy.   Lab work: none If you have labs (blood work) drawn today and your tests are completely normal, you will receive your results only by: Marland Kitchen MyChart Message (if you have MyChart) OR . A paper copy in the mail If you have any lab test that is abnormal or we need to change your treatment, we will call you to review the results.  Testing/Procedures: none  Follow-Up: Remote monitoring is used to monitor your Pacemaker  from home. This monitoring reduces the number of office visits required to check your device to one time per year. It allows [place] to keep an eye on the functioning of your device to ensure it is working properly. You are scheduled for a device check from home on 02/03/2018. You may send your transmission at any time that day. If you have a wireless device, the transmission will be sent automatically. After your physician reviews your transmission, you will receive a postcard with your next transmission date.   At [facility], you and your health needs are our priority.  As part of our continuing mission to provide you with exceptional heart care, we have created designated Provider Care Teams.  These Care Teams include your primary Cardiologist (physician) and Advanced Practice Providers (APPs -  Physician Assistants and Nurse Practitioners) who all work together to provide you with the care you need, when you need it. You will need a follow up appointment in 1 years.  Please call our office 2 months in advance to schedule this appointment.  You may see Dr Graciela Husbands or one of the following Advanced Practice Providers on your designated Care Team:   Gypsy Balsam, NP . Francis Dowse, PA-C  Any Other Special Instructions Will Be Listed Below (If Applicable).

## 2017-11-18 NOTE — Progress Notes (Signed)
Electrophysiology Office Note Date: 11/18/2017  ID:  Janet Everett, DOB 1935/11/15, MRN [id number]  PCP: Karle Plumber, MD Electrophysiologist: Graciela Husbands  CC: Pacemaker follow-up  Janet Everett is a 82 y.o. female seen today for Dr Graciela Husbands.  She presents today for routine electrophysiology followup.  Since last being seen in our clinic, the patient reports doing very well.  She denies chest pain, palpitations (except for rarely), dyspnea, PND, orthopnea, nausea, vomiting, dizziness, syncope, edema, weight gain, or early satiety.  Device History: STJ dual chamber PPM implanted 2018 for SSS   Past Medical History:  Diagnosis Date  . Atrial fibrillation and flutter (HCC)   . Essential hypertension 04/01/2016  . Lightheadedness/presyncope 04/01/2016   Past Surgical History:  Procedure Laterality Date  . BREAST CYST ASPIRATION Right age 55  . BREAST CYST EXCISION Right    age 69 and 67  . BREAST EXCISIONAL BIOPSY Right 2011  . PACEMAKER IMPLANT N/A 03/31/2016   Procedure: Pacemaker Implant;  Surgeon: Duke Salvia, MD;  Location: [facility] INVASIVE CV LAB;  Service: Cardiovascular;  Laterality: N/A;    Current Outpatient Medications  Medication Sig Dispense Refill  . ALPRAZolam (XANAX) 0.5 MG tablet Take 0.5 mg by mouth at bedtime as needed for anxiety.    Marland Kitchen atorvastatin (LIPITOR) 40 MG tablet TAKE 1 TABLET(40 MG) BY MOUTH DAILY 30 tablet 11  . diltiazem (CARDIZEM CD) 120 MG 24 hr capsule Take 1 capsule (120 mg total) by mouth daily. 30 capsule 9  . furosemide (LASIX) 20 MG tablet Take 1 tablet (20 mg total) by mouth as needed for fluid. 30 tablet 3  . magnesium hydroxide (MILK OF MAGNESIA) 400 MG/5ML suspension Take 30 mLs by mouth daily as needed for mild constipation.    . Multiple Vitamin (MULTIVITAMIN WITH MINERALS) TABS tablet Take 1 tablet by mouth daily.    . nitroGLYCERIN (NITROSTAT) 0.4 MG SL tablet Place 0.4 mg under the tongue every 5 (five) minutes as needed for chest pain.     Marland Kitchen spironolactone (ALDACTONE) 25 MG tablet TAKE 1 TABLET BY MOUTH DAILY 30 tablet 0  . XARELTO 20 MG TABS tablet TAKE 1 TABLET BY MOUTH DAILY WITH SUPPER 30 tablet 0   No current facility-administered medications for this visit.     Allergies:   Patient has no known allergies.   Social History: Social History   Socioeconomic History  . Marital status: Married    Spouse name: Not on file  . Number of children: Not on file  . Years of education: Not on file  . Highest education level: Not on file  Occupational History  . Not on file  Social Needs  . Financial resource strain: Not on file  . Food insecurity:    Worry: Not on file    Inability: Not on file  . Transportation needs:    Medical: Not on file    Non-medical: Not on file  Tobacco Use  . Smoking status: Former Smoker    Types: Cigarettes  . Smokeless tobacco: Former Neurosurgeon  . Tobacco comment: pt states she quit smoking 50 years ago  Substance and Sexual Activity  . Alcohol use: Never    Frequency: Never  . Drug use: Never  . Sexual activity: Not on file  Lifestyle  . Physical activity:    Days per week: Not on file    Minutes per session: Not on file  . Stress: Not on file  Relationships  . Social connections:  Talks on phone: Not on file    Gets together: Not on file    Attends religious service: Not on file    Active member of club or organization: Not on file    Attends meetings of clubs or organizations: Not on file    Relationship status: Not on file  . Intimate partner violence:    Fear of current or ex partner: Not on file    Emotionally abused: Not on file    Physically abused: Not on file    Forced sexual activity: Not on file  Other Topics Concern  . Not on file  Social History Narrative  . Not on file    Family History: Family History  Problem Relation Age of Onset  . Hypertension Mother   . Breast cancer Maternal Aunt      Review of Systems: All other systems reviewed and are  otherwise negative except as noted above.   Physical Exam: VS:  BP 118/70   Pulse 87   Ht 5\' 3"  (1.6 m)   Wt 158 lb (71.7 kg)   SpO2 95%   BMI 27.99 kg/m  , BMI Body mass index is 27.99 kg/m.  GEN- The patient is elderly appearing, alert and oriented x 3 today.   HEENT: normocephalic, atraumatic; sclera clear, conjunctiva pink; hearing intact; oropharynx clear; neck supple  Lungs- Clear to ausculation bilaterally, normal work of breathing.  No wheezes, rales, rhonchi Heart- Regular rate and rhythm  GI- soft, non-tender, non-distended, bowel sounds present  Extremities- no clubbing, cyanosis, or edema  MS- no significant deformity or atrophy Skin- warm and dry, no rash or lesion; PPM pocket well healed Psych- euthymic mood, full affect Neuro- strength and sensation are intact  PPM Interrogation- reviewed in detail today,  See PACEART report  EKG:  EKG is not ordered today.  Recent Labs: 05/14/2017: BUN 16; Creatinine, Ser 1.15; Hemoglobin 13.0; Platelets 238; Potassium 4.9; Sodium 141   Wt Readings from Last 3 Encounters:  11/18/17 158 lb (71.7 kg)  05/13/17 151 lb (68.5 kg)  08/06/16 155 lb (70.3 kg)     Other studies Reviewed: Additional studies/ records that were reviewed today include: Dr Odessa Fleming office notes   Assessment and Plan:  1.  Symptomatic bradycardia Normal PPM function See Pace Art report No changes today  2.  HTN Stable No change required today  3.  Paroxysmal atrial fibrillation Burden by device interrogation <1% Continue Xarelto for CHADS2VASC of 4  4.  Chronic diastolic heart failure Stable No change required today Labs followed by PCP   Current medicines are reviewed at length with the patient today.   The patient does not have concerns regarding her medicines.  The following changes were made today:  none  Labs/ tests ordered today include:  Orders Placed This Encounter  Procedures  . CUP PACEART INCLINIC DEVICE CHECK      Disposition:   Follow up with Arsenio Katz, Dr Graciela Husbands 1 year  Signed, Gypsy Balsam, NP 11/18/2017 12:08 PM  [facility] [address] [place] [address] [phone] (office) [phone] (fax)

## 2018-01-09 LAB — CUP PACEART REMOTE DEVICE CHECK
Battery Remaining Longevity: 120 mo
Battery Remaining Percentage: 95.5 %
Battery Voltage: 2.99 V
Brady Statistic AP VS Percent: 93 %
Brady Statistic AS VS Percent: 5.8 %
Brady Statistic RV Percent Paced: 1 %
Date Time Interrogation Session: 20191016080019
Implantable Lead Implant Date: 20180312
Implantable Lead Location: 753860
Implantable Pulse Generator Implant Date: 20180312
Lead Channel Impedance Value: 480 Ohm
Lead Channel Pacing Threshold Amplitude: 0.625 V
Lead Channel Pacing Threshold Amplitude: 0.75 V
Lead Channel Pacing Threshold Pulse Width: 0.5 ms
Lead Channel Sensing Intrinsic Amplitude: 5 mV
Lead Channel Setting Pacing Amplitude: 0.875
Lead Channel Setting Pacing Pulse Width: 0.5 ms
Lead Channel Setting Sensing Sensitivity: 2 mV
MDC IDC LEAD IMPLANT DT: 20180312
MDC IDC LEAD LOCATION: 753859
MDC IDC MSMT LEADCHNL RA PACING THRESHOLD PULSEWIDTH: 0.5 ms
MDC IDC MSMT LEADCHNL RV IMPEDANCE VALUE: 630 Ohm
MDC IDC MSMT LEADCHNL RV SENSING INTR AMPL: 12 mV
MDC IDC PG SERIAL: 8005635
MDC IDC SET LEADCHNL RA PACING AMPLITUDE: 2 V
MDC IDC STAT BRADY AP VP PERCENT: 1 %
MDC IDC STAT BRADY AS VP PERCENT: 1 %
MDC IDC STAT BRADY RA PERCENT PACED: 93 %

## 2018-02-03 ENCOUNTER — Ambulatory Visit (INDEPENDENT_AMBULATORY_CARE_PROVIDER_SITE_OTHER): Payer: Medicare HMO

## 2018-02-03 DIAGNOSIS — R001 Bradycardia, unspecified: Secondary | ICD-10-CM | POA: Diagnosis not present

## 2018-02-03 DIAGNOSIS — I495 Sick sinus syndrome: Secondary | ICD-10-CM

## 2018-02-04 NOTE — Progress Notes (Signed)
Remote pacemaker transmission.   

## 2018-02-07 LAB — CUP PACEART REMOTE DEVICE CHECK
Battery Remaining Longevity: 119 mo
Battery Remaining Percentage: 95.5 %
Brady Statistic AP VS Percent: 96 %
Brady Statistic RA Percent Paced: 96 %
Brady Statistic RV Percent Paced: 1 %
Date Time Interrogation Session: 20200115144453
Implantable Lead Location: 753859
Implantable Lead Location: 753860
Implantable Pulse Generator Implant Date: 20180312
Lead Channel Impedance Value: 460 Ohm
Lead Channel Pacing Threshold Amplitude: 0.625 V
Lead Channel Pacing Threshold Pulse Width: 0.5 ms
Lead Channel Sensing Intrinsic Amplitude: 12 mV
Lead Channel Sensing Intrinsic Amplitude: 4.7 mV
Lead Channel Setting Pacing Amplitude: 0.875
Lead Channel Setting Pacing Amplitude: 2 V
Lead Channel Setting Pacing Pulse Width: 0.5 ms
Lead Channel Setting Sensing Sensitivity: 2 mV
MDC IDC LEAD IMPLANT DT: 20180312
MDC IDC LEAD IMPLANT DT: 20180312
MDC IDC MSMT BATTERY VOLTAGE: 2.99 V
MDC IDC MSMT LEADCHNL RA PACING THRESHOLD AMPLITUDE: 0.75 V
MDC IDC MSMT LEADCHNL RV IMPEDANCE VALUE: 630 Ohm
MDC IDC MSMT LEADCHNL RV PACING THRESHOLD PULSEWIDTH: 0.5 ms
MDC IDC STAT BRADY AP VP PERCENT: 1 %
MDC IDC STAT BRADY AS VP PERCENT: 1 %
MDC IDC STAT BRADY AS VS PERCENT: 3.5 %
Pulse Gen Model: 2272
Pulse Gen Serial Number: 8005635

## 2018-03-23 ENCOUNTER — Telehealth: Payer: Self-pay | Admitting: Cardiology

## 2018-03-23 DIAGNOSIS — R001 Bradycardia, unspecified: Secondary | ICD-10-CM

## 2018-03-23 DIAGNOSIS — Z950 Presence of cardiac pacemaker: Secondary | ICD-10-CM

## 2018-03-23 NOTE — Telephone Encounter (Signed)
Patient calling b/c when she bends down to work she feels like her PPM flips over. She wants to know if she can put tap on her skin to help hold the PPM in place. Instructed pt that I would send a message to Device Tech RN and someone will call her back to discuss this further.

## 2018-03-23 NOTE — Telephone Encounter (Signed)
Spoke with patient. She reports that about 2 weeks ago she was cleaning her tub and ever since then she notices her PPM flipping over in her chest when she leans over. She denies pain, states it just "feels strange." She touches it gently and it settles back into place. No recent trauma to site, patient denies any issues unless she is bending over. No signs/symptoms of infection.  Advised I will discuss with Dr. Graciela Husbands and call her back. Patient is agreeable to this plan.

## 2018-03-23 NOTE — Telephone Encounter (Signed)
Discussed with Dr. Graciela Husbands-- plan to obtain CXR to ensure PPM leads haven't pulled back.   Spoke with patient. She is agreeable to CXR at GI at [facility], plans to go on Thursday morning. Order entered.  She denies any dizziness or syncope recently. HR has been stable when she checks it. Pt also agrees to send a transmission tonight for review tomorrow. Transmission instructions given. ED precautions given for new or worsening symptoms. Pt verbalizes understanding and denies additional questions at this time.

## 2018-03-24 NOTE — Telephone Encounter (Signed)
Spoke with patient. Advised that manual transmission shows normal device function. Will await CXR. Pt in agreement with plan.

## 2018-03-25 ENCOUNTER — Ambulatory Visit
Admission: RE | Admit: 2018-03-25 | Discharge: 2018-03-25 | Disposition: A | Discharge status: Home / Self Care | Payer: Medicare HMO | Source: Ambulatory Visit | Attending: Internal Medicine | Admitting: Internal Medicine

## 2018-03-25 DIAGNOSIS — Z950 Presence of cardiac pacemaker: Secondary | ICD-10-CM

## 2018-03-25 DIAGNOSIS — R001 Bradycardia, unspecified: Secondary | ICD-10-CM

## 2018-03-25 NOTE — Telephone Encounter (Signed)
CXR reviewed. Pacemaker has shifted in pocket. Leads stable. She feels that device is flipping over at night. Will have her come in next week to see Dr Graciela Husbands to discuss pocket revision.  Janet Balsam, NP 03/25/2018 9:45 AM

## 2018-03-30 ENCOUNTER — Encounter: Payer: Self-pay | Admitting: Internal Medicine

## 2018-03-30 ENCOUNTER — Ambulatory Visit (INDEPENDENT_AMBULATORY_CARE_PROVIDER_SITE_OTHER): Payer: Medicare HMO | Admitting: Internal Medicine

## 2018-03-30 VITALS — BP 134/80 | HR 73 | Ht 63.0 in | Wt 157.0 lb

## 2018-03-30 DIAGNOSIS — R600 Localized edema: Secondary | ICD-10-CM

## 2018-03-30 DIAGNOSIS — R001 Bradycardia, unspecified: Secondary | ICD-10-CM

## 2018-03-30 DIAGNOSIS — Z950 Presence of cardiac pacemaker: Secondary | ICD-10-CM | POA: Diagnosis not present

## 2018-03-30 DIAGNOSIS — I480 Paroxysmal atrial fibrillation: Secondary | ICD-10-CM | POA: Diagnosis not present

## 2018-03-30 LAB — CUP PACEART INCLINIC DEVICE CHECK
Battery Remaining Longevity: 115 mo
Battery Voltage: 2.99 V
Brady Statistic RA Percent Paced: 94 %
Brady Statistic RV Percent Paced: 0.47 %
Date Time Interrogation Session: 20200310160901
Implantable Lead Implant Date: 20180312
Implantable Lead Location: 753859
Implantable Lead Location: 753860
Implantable Pulse Generator Implant Date: 20180312
Lead Channel Impedance Value: 462.5 Ohm
Lead Channel Impedance Value: 600 Ohm
Lead Channel Pacing Threshold Amplitude: 0.625 V
Lead Channel Pacing Threshold Pulse Width: 0.5 ms
Lead Channel Pacing Threshold Pulse Width: 0.5 ms
Lead Channel Sensing Intrinsic Amplitude: 12 mV
Lead Channel Sensing Intrinsic Amplitude: 4.3 mV
Lead Channel Setting Pacing Amplitude: 0.875
Lead Channel Setting Pacing Pulse Width: 0.5 ms
Lead Channel Setting Sensing Sensitivity: 2 mV
MDC IDC LEAD IMPLANT DT: 20180312
MDC IDC MSMT LEADCHNL RV PACING THRESHOLD AMPLITUDE: 0.625 V
MDC IDC SET LEADCHNL RA PACING AMPLITUDE: 2 V
Pulse Gen Model: 2272
Pulse Gen Serial Number: 8005635

## 2018-03-30 MED ORDER — NITROGLYCERIN 0.4 MG SL SUBL: 0.4000 mg | SUBLINGUAL_TABLET | SUBLINGUAL | 3 refills | Status: DC | PRN

## 2018-03-30 MED ORDER — RIVAROXABAN 20 MG PO TABS: 20.0000 mg | ORAL_TABLET | Freq: Every day | ORAL | 3 refills | Status: DC

## 2018-03-30 NOTE — H&P (View-Only) (Signed)
    Patient Care Team: Arvind, Moogali M, MD as PCP - General (Internal Medicine)   HPI  Janet Everett is a 83 y.o. female Sen in followup for Afib and post termination pauses and sinus node dysfunction for which she underwent pacing 3/`18  She has hx of CAD with prior stenting. Has Hypertension  Anticoagulated with her Xarelto  Date Cr Hgb  3/18 0.98 13.6  11/18 0.79 13.2        No bleeding, but has had problems with headaches dating back to January.  They become increasingly frequent and more likely to be present upon awakening.  They are not lateralizing.    She comes in today with complaints that over the last couple of weeks her pacemaker has started moving and sometimes is up on it and requiring her to flatten it.  Having been told this, we obtained a chest x-ray which shows no evidence of coiling of the lead  This is been for her quite annoying.       Past Medical History:  Diagnosis Date  . Atrial fibrillation and flutter (HCC)   . Essential hypertension 04/01/2016  . Lightheadedness/presyncope 04/01/2016    Past Surgical History:  Procedure Laterality Date  . BREAST CYST ASPIRATION Right age 18  . BREAST CYST EXCISION Right    age 18 and 2010  . BREAST EXCISIONAL BIOPSY Right 2011  . PACEMAKER IMPLANT N/A 03/31/2016   Procedure: Pacemaker Implant;  Surgeon: Steven C Klein, MD;  Location: [facility] INVASIVE CV LAB;  Service: Cardiovascular;  Laterality: N/A;    Current Outpatient Medications  Medication Sig Dispense Refill  . ALPRAZolam (XANAX) 0.5 MG tablet Take 0.5 mg by mouth at bedtime as needed for anxiety.    . atorvastatin (LIPITOR) 40 MG tablet TAKE 1 TABLET(40 MG) BY MOUTH DAILY 30 tablet 11  . diltiazem (CARDIZEM CD) 120 MG 24 hr capsule Take 1 capsule (120 mg total) by mouth daily. 30 capsule 9  . furosemide (LASIX) 20 MG tablet Take 1 tablet (20 mg total) by mouth as needed for fluid. 30 tablet 3  . magnesium hydroxide (MILK OF MAGNESIA) 400 MG/5ML  suspension Take 30 mLs by mouth daily as needed for mild constipation.    . Multiple Vitamin (MULTIVITAMIN WITH MINERALS) TABS tablet Take 1 tablet by mouth daily.    . nitroGLYCERIN (NITROSTAT) 0.4 MG SL tablet Place 1 tablet (0.4 mg total) under the tongue every 5 (five) minutes as needed for chest pain. 25 tablet 3  . rivaroxaban (XARELTO) 20 MG TABS tablet Take 1 tablet (20 mg total) by mouth daily with supper. 90 tablet 3  . spironolactone (ALDACTONE) 25 MG tablet TAKE 1 TABLET BY MOUTH DAILY 30 tablet 0   No current facility-administered medications for this visit.     No Known Allergies    Review of Systems negative except from HPI and PMH  Physical Exam BP 134/80   Pulse 73   Ht 5' 3" (1.6 m)   Wt 157 lb (71.2 kg)   SpO2 97%   BMI 27.81 kg/m  Well developed and nourished in no acute distress HENT normal Neck supple with JVP-  flat   Clear Regular rate and rhythm, no murmurs or gallops Abd-soft with active BS No Clubbing cyanosis Right greater than left leg swelling Skin-warm and dry A & Oriented  Grossly normal sensory and motor function  ECG  Sinus @ 73 14/12/42 LVH w repol PVCs freq LBBB infer axis w qS avR   but not avL     Assessment and  Plan Atrial fibrillation-paroxysmal  Sinus node dysfunction  Pacemaker St Jude  Hypertension  Asymmetric edema    BP well controlled  Euvolemic continue current meds  The device has come loose from its anchoring suture.  This is been quite a nuisance to her.  We discussed the possibility of securing the device.  We reviewed the potential risk of infection.  Not withstanding, she would like to proceed with this.  We will anticipate about a 1 inch incision within securing of the device.  It is unlikely that she has a DVT in her right leg given that she is on Xarelto.  We will check however to make sure there is no evidence of venous engorgement to suggest possible proximal occlusion    Current medicines are  reviewed at length with the patient today .  The patient does not have concerns regarding medicines.

## 2018-03-30 NOTE — Progress Notes (Signed)
Patient Care Team: Karle Plumber, MD as PCP - General (Internal Medicine)   HPI  Janet Everett is a 83 y.o. female [name] in followup for Afib and post termination pauses and sinus node dysfunction for which she underwent pacing 3/`18  She has hx of CAD with prior stenting. Has Hypertension  Anticoagulated with her Xarelto  Date Cr Hgb  3/18 0.98 13.6  11/18 0.79 13.2        No bleeding, but has had problems with headaches dating back to January.  They become increasingly frequent and more likely to be present upon awakening.  They are not lateralizing.    She comes in today with complaints that over the last couple of weeks her pacemaker has started moving and sometimes is up on it and requiring her to flatten it.  Having been told this, we obtained a chest x-ray which shows no evidence of coiling of the lead  This is been for her quite annoying.       Past Medical History:  Diagnosis Date  . Atrial fibrillation and flutter (HCC)   . Essential hypertension 04/01/2016  . Lightheadedness/presyncope 04/01/2016    Past Surgical History:  Procedure Laterality Date  . BREAST CYST ASPIRATION Right age 77  . BREAST CYST EXCISION Right    age 42 and 78  . BREAST EXCISIONAL BIOPSY Right 2011  . PACEMAKER IMPLANT N/A 03/31/2016   Procedure: Pacemaker Implant;  Surgeon: Duke Salvia, MD;  Location: [facility] INVASIVE CV LAB;  Service: Cardiovascular;  Laterality: N/A;    Current Outpatient Medications  Medication Sig Dispense Refill  . ALPRAZolam (XANAX) 0.5 MG tablet Take 0.5 mg by mouth at bedtime as needed for anxiety.    Marland Kitchen atorvastatin (LIPITOR) 40 MG tablet TAKE 1 TABLET(40 MG) BY MOUTH DAILY 30 tablet 11  . diltiazem (CARDIZEM CD) 120 MG 24 hr capsule Take 1 capsule (120 mg total) by mouth daily. 30 capsule 9  . furosemide (LASIX) 20 MG tablet Take 1 tablet (20 mg total) by mouth as needed for fluid. 30 tablet 3  . magnesium hydroxide (MILK OF MAGNESIA) 400 MG/5ML  suspension Take 30 mLs by mouth daily as needed for mild constipation.    . Multiple Vitamin (MULTIVITAMIN WITH MINERALS) TABS tablet Take 1 tablet by mouth daily.    . nitroGLYCERIN (NITROSTAT) 0.4 MG SL tablet Place 1 tablet (0.4 mg total) under the tongue every 5 (five) minutes as needed for chest pain. 25 tablet 3  . rivaroxaban (XARELTO) 20 MG TABS tablet Take 1 tablet (20 mg total) by mouth daily with supper. 90 tablet 3  . spironolactone (ALDACTONE) 25 MG tablet TAKE 1 TABLET BY MOUTH DAILY 30 tablet 0   No current facility-administered medications for this visit.     No Known Allergies    Review of Systems negative except from HPI and PMH  Physical Exam BP 134/80   Pulse 73   Ht 5\' 3"  (1.6 m)   Wt 157 lb (71.2 kg)   SpO2 97%   BMI 27.81 kg/m  Well developed and nourished in no acute distress HENT normal Neck supple with JVP-  flat   Clear Regular rate and rhythm, no murmurs or gallops Abd-soft with active BS No Clubbing cyanosis Right greater than left leg swelling Skin-warm and dry A & Oriented  Grossly normal sensory and motor function  ECG  Sinus @ 73 14/12/42 LVH w repol PVCs freq LBBB infer axis w qS avR  but not avL     Assessment and  Plan Atrial fibrillation-paroxysmal  Sinus node dysfunction  Pacemaker St Jude  Hypertension  Asymmetric edema    BP well controlled  Euvolemic continue current meds  The device has come loose from its anchoring suture.  This is been quite a nuisance to her.  We discussed the possibility of securing the device.  We reviewed the potential risk of infection.  Not withstanding, she would like to proceed with this.  We will anticipate about a 1 inch incision within securing of the device.  It is unlikely that she has a DVT in her right leg given that she is on Xarelto.  We will check however to make sure there is no evidence of venous engorgement to suggest possible proximal occlusion    Current medicines are  reviewed at length with the patient today .  The patient does not have concerns regarding medicines.

## 2018-03-30 NOTE — Patient Instructions (Addendum)
Medication Instructions:  Your physician recommends that you continue on your current medications as directed. Please refer to the Current Medication list given to you today.  Labwork: You will have labs drawn today: CBC and BMP  Testing/Procedures: Your physician suggests you have a pacemaker pocket revision  Your physician suggests you have a lower leg doppler study performed.   Follow-Up: Your physician recommends that you schedule a follow-up appointment in:   10-14 days with the Device Clinic for a wound check.   Any Other Special Instructions Will Be Listed Below (If Applicable).   Please arrive at the [facility] main entrance of [facility] where [address] is located. Proceed to patient registration.  Your procedure is scheduled for March 13. Please arrive by 7:30am.  Do not eat or drink after midnight prior to procedure Do not take your Xarelto the night before your procedure.  Do not take Lasix the morning of the procedure. You may take the remainder of your medicines with a sip of water. You will go home the same day. You will need someone to drive you home at discharge   If you need a refill on your cardiac medications before your next appointment, please call your pharmacy.

## 2018-03-31 LAB — BASIC METABOLIC PANEL
BUN/Creatinine Ratio: 15 (ref 12–28)
BUN: 17 mg/dL (ref 8–27)
CO2: 21 mmol/L (ref 20–29)
Calcium: 10.9 mg/dL — ABNORMAL HIGH (ref 8.7–10.3)
Chloride: 105 mmol/L (ref 96–106)
Creatinine, Ser: 1.16 mg/dL — ABNORMAL HIGH (ref 0.57–1.00)
GFR calc Af Amer: 51 mL/min/{1.73_m2} — ABNORMAL LOW (ref >59)
GFR calc non Af Amer: 45 mL/min/{1.73_m2} — ABNORMAL LOW (ref >59)
Glucose: 90 mg/dL (ref 65–99)
Potassium: 4.5 mmol/L (ref 3.5–5.2)
Sodium: 139 mmol/L (ref 134–144)

## 2018-03-31 LAB — CBC
Hematocrit: 37.4 % (ref 34.0–46.6)
Hemoglobin: 12.6 g/dL (ref 11.1–15.9)
MCH: 27.8 pg (ref 26.6–33.0)
MCHC: 33.7 g/dL (ref 31.5–35.7)
MCV: 82 fL (ref 79–97)
Platelets: 216 10*3/uL (ref 150–450)
RBC: 4.54 x10E6/uL (ref 3.77–5.28)
RDW: 12.5 % (ref 11.7–15.4)
WBC: 7.5 10*3/uL (ref 3.4–10.8)

## 2018-04-01 ENCOUNTER — Ambulatory Visit (HOSPITAL_COMMUNITY)
Admission: RE | Admit: 2018-04-01 | Discharge: 2018-04-01 | Disposition: A | Discharge status: Home / Self Care | Payer: Medicare HMO | Source: Ambulatory Visit | Attending: Cardiovascular Disease | Admitting: Cardiovascular Disease

## 2018-04-01 ENCOUNTER — Other Ambulatory Visit: Payer: Self-pay

## 2018-04-01 DIAGNOSIS — R600 Localized edema: Secondary | ICD-10-CM

## 2018-04-02 ENCOUNTER — Ambulatory Visit (HOSPITAL_COMMUNITY)
Admission: RE | Disposition: A | Discharge status: Home / Self Care | Payer: Self-pay | Source: Home / Self Care | Attending: Internal Medicine

## 2018-04-02 ENCOUNTER — Other Ambulatory Visit: Payer: Self-pay

## 2018-04-02 ENCOUNTER — Ambulatory Visit (HOSPITAL_COMMUNITY)
Admission: RE | Admit: 2018-04-02 | Discharge: 2018-04-02 | Disposition: A | Discharge status: Home / Self Care | Payer: Medicare HMO | Attending: Internal Medicine | Admitting: Internal Medicine

## 2018-04-02 DIAGNOSIS — R600 Localized edema: Secondary | ICD-10-CM | POA: Insufficient documentation

## 2018-04-02 DIAGNOSIS — I2510 Atherosclerotic heart disease of native coronary artery without angina pectoris: Secondary | ICD-10-CM | POA: Diagnosis not present

## 2018-04-02 DIAGNOSIS — I10 Essential (primary) hypertension: Secondary | ICD-10-CM | POA: Diagnosis present

## 2018-04-02 DIAGNOSIS — I4892 Unspecified atrial flutter: Secondary | ICD-10-CM | POA: Diagnosis not present

## 2018-04-02 DIAGNOSIS — Z79899 Other long term (current) drug therapy: Secondary | ICD-10-CM | POA: Insufficient documentation

## 2018-04-02 DIAGNOSIS — Z7901 Long term (current) use of anticoagulants: Secondary | ICD-10-CM | POA: Insufficient documentation

## 2018-04-02 DIAGNOSIS — R51 Headache: Secondary | ICD-10-CM | POA: Diagnosis not present

## 2018-04-02 DIAGNOSIS — Z950 Presence of cardiac pacemaker: Secondary | ICD-10-CM | POA: Insufficient documentation

## 2018-04-02 DIAGNOSIS — I495 Sick sinus syndrome: Secondary | ICD-10-CM | POA: Insufficient documentation

## 2018-04-02 DIAGNOSIS — I480 Paroxysmal atrial fibrillation: Secondary | ICD-10-CM | POA: Insufficient documentation

## 2018-04-02 DIAGNOSIS — T82121A Displacement of cardiac pulse generator (battery), initial encounter: Secondary | ICD-10-CM

## 2018-04-02 DIAGNOSIS — R001 Bradycardia, unspecified: Secondary | ICD-10-CM

## 2018-04-02 HISTORY — PX: POCKET REVISION/RELOCATION: EP1222

## 2018-04-02 LAB — SURGICAL PCR SCREEN
MRSA, PCR: NEGATIVE
Staphylococcus aureus: NEGATIVE

## 2018-04-02 IMAGING — CR DG CHEST 1V PORT
1 series · 1 of 1 positions shown · non-contrast
Comparison: none

CLINICAL DATA: A-fib with SOB today. Hx of HTN.

EXAM:
PORTABLE CHEST - 1 VIEW

[AP]
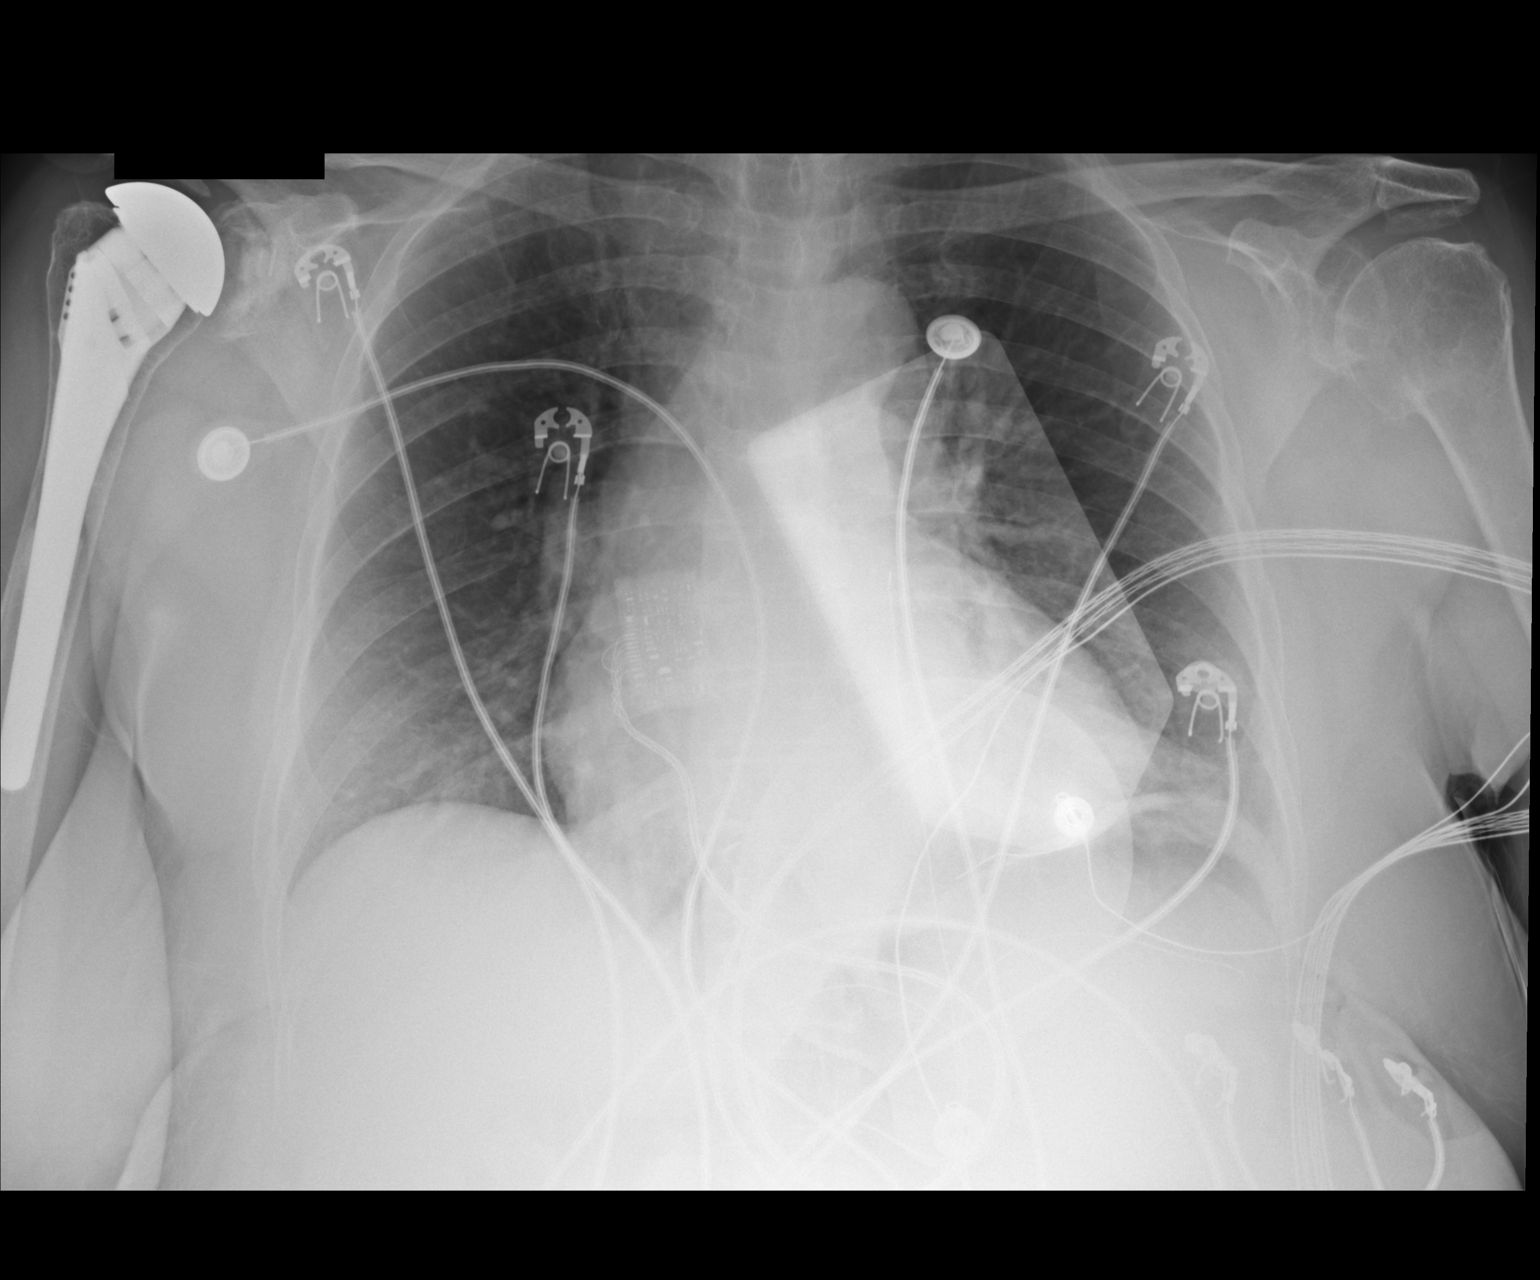

[1 of 1 positions shown; findings below may reference images not displayed]

FINDINGS: Monitoring devices overlie the patient. Relatively low lung volumes
with no focal infiltrate or overt edema.

Heart size upper limits normal for technique.

No effusion.  No pneumothorax.

Right shoulder arthroplasty. Degenerative change in the left
shoulder.
IMPRESSION: No acute cardiopulmonary disease.

## 2018-04-02 SURGERY — POCKET REVISION/RELOCATION

## 2018-04-02 MED ORDER — MUPIROCIN 2 % EX OINT
TOPICAL_OINTMENT | CUTANEOUS | Status: AC
  Filled 2018-04-02: qty 22

## 2018-04-02 MED ORDER — LIDOCAINE HCL 1 % IJ SOLN
INTRAMUSCULAR | Status: AC
  Filled 2018-04-02: qty 60

## 2018-04-02 MED ORDER — MIDAZOLAM HCL 5 MG/5ML IJ SOLN
INTRAMUSCULAR | Status: AC
  Filled 2018-04-02: qty 5

## 2018-04-02 MED ORDER — CHLORHEXIDINE GLUCONATE 4 % EX LIQD: 60.0000 mL | Freq: Once | CUTANEOUS | Status: DC

## 2018-04-02 MED ORDER — MIDAZOLAM HCL 5 MG/5ML IJ SOLN
INTRAMUSCULAR | Status: DC | PRN
  Administered 2018-04-02 (×2): 0.5 mg via INTRAVENOUS

## 2018-04-02 MED ORDER — ACETAMINOPHEN 325 MG PO TABS
325.0000 mg | ORAL_TABLET | ORAL | Status: DC | PRN
  Filled 2018-04-02: qty 2

## 2018-04-02 MED ORDER — SODIUM CHLORIDE 0.9 % IV SOLN: INTRAVENOUS | Status: AC

## 2018-04-02 MED ORDER — CEFAZOLIN SODIUM-DEXTROSE 2-4 GM/100ML-% IV SOLN
2.0000 g | INTRAVENOUS | Status: AC
  Administered 2018-04-02: 2 g via INTRAVENOUS

## 2018-04-02 MED ORDER — LIDOCAINE HCL (PF) 1 % IJ SOLN
INTRAMUSCULAR | Status: DC | PRN
  Administered 2018-04-02: 45 mL

## 2018-04-02 MED ORDER — SODIUM CHLORIDE 0.9 % IV SOLN
INTRAVENOUS | Status: DC
  Administered 2018-04-02: 09:00:00 via INTRAVENOUS

## 2018-04-02 MED ORDER — CEFAZOLIN SODIUM-DEXTROSE 2-4 GM/100ML-% IV SOLN
INTRAVENOUS | Status: AC
  Filled 2018-04-02: qty 100

## 2018-04-02 MED ORDER — FENTANYL CITRATE (PF) 100 MCG/2ML IJ SOLN
INTRAMUSCULAR | Status: AC
  Filled 2018-04-02: qty 2

## 2018-04-02 MED ORDER — SODIUM CHLORIDE 0.9 % IV SOLN
INTRAVENOUS | Status: AC
  Filled 2018-04-02: qty 2

## 2018-04-02 MED ORDER — CEFAZOLIN SODIUM-DEXTROSE 1-4 GM/50ML-% IV SOLN: INTRAVENOUS | Status: DC | PRN

## 2018-04-02 MED ORDER — SODIUM CHLORIDE 0.9 % IV SOLN: 80.0000 mg | INTRAVENOUS | Status: DC

## 2018-04-02 MED ORDER — ONDANSETRON HCL 4 MG/2ML IJ SOLN: 4.0000 mg | Freq: Four times a day (QID) | INTRAMUSCULAR | Status: DC | PRN

## 2018-04-02 MED ORDER — FENTANYL CITRATE (PF) 100 MCG/2ML IJ SOLN
INTRAMUSCULAR | Status: DC | PRN
  Administered 2018-04-02 (×2): 12.5 ug via INTRAVENOUS

## 2018-04-02 SURGICAL SUPPLY — 4 items
CABLE SURGICAL S-101-97-12 (CABLE) ×2 IMPLANT
HEMOSTAT SURGICEL 2X4 FIBR (HEMOSTASIS) ×2 IMPLANT
PAD PRO RADIOLUCENT 2001M-C (PAD) ×2 IMPLANT
TRAY PACEMAKER INSERTION (PACKS) ×2 IMPLANT

## 2018-04-02 NOTE — Discharge Instructions (Signed)
Pacemaker Battery Change, Care After  This sheet gives you information about how to care for yourself after your procedure. Your health care provider may also give you more specific instructions. If you have problems or questions, contact your health care provider.  What can I expect after the procedure?  After your procedure, it is common to have:   Pain or soreness at the site where the pacemaker was inserted.   Swelling at the site where the pacemaker was inserted.  Follow these instructions at home:  Incision care     Keep the incision clean and dry.  ? Do not take baths, swim, or use a hot tub until your health care provider approves.  ? You may shower the day after your procedure, or as directed by your health care provider.  ? Pat the area dry with a clean towel. Do not rub the area. This may cause bleeding.   Follow instructions from your health care provider about how to take care of your incision. Make sure you:  ? Wash your hands with soap and water before you change your bandage (dressing). If soap and water are not available, use hand sanitizer.  ? Change your dressing as told by your health care provider.  ? Leave stitches (sutures), skin glue, or adhesive strips in place. These skin closures may need to stay in place for 2 weeks or longer. If adhesive strip edges start to loosen and curl up, you may trim the loose edges. Do not remove adhesive strips completely unless your health care provider tells you to do that.   Check your incision area every day for signs of infection. Check for:  ? More redness, swelling, or pain.  ? More fluid or blood.  ? Warmth.  ? Pus or a bad smell.  Activity   Do not lift anything that is heavier than 10 lb (4.5 kg) until your health care provider says it is okay to do so.   For the first 2 weeks, or as long as told by your health care provider:  ? Avoid lifting your left arm higher than your shoulder.  ? Be gentle when you move your arms over your head. It is okay  to raise your arm to comb your hair.  ? Avoid strenuous exercise.   Ask your health care provider when it is okay to:  ? Resume your normal activities.  ? Return to work or school.  ? Resume sexual activity.  Eating and drinking   Eat a heart-healthy diet. This should include plenty of fresh fruits and vegetables, whole grains, low-fat dairy products, and lean protein like chicken and fish.   Limit alcohol intake to no more than 1 drink a day for non-pregnant women and 2 drinks a day for men. One drink equals 12 oz of beer, 5 oz of wine, or 1 oz of hard liquor.   Check ingredients and nutrition facts on packaged foods and beverages. Avoid the following types of food:  ? Food that is high in salt (sodium).  ? Food that is high in saturated fat, like full-fat dairy or red meat.  ? Food that is high in trans fat, like fried food.  ? Food and drinks that are high in sugar.  Lifestyle   Do not use any products that contain nicotine or tobacco, such as cigarettes and e-cigarettes. If you need help quitting, ask your health care provider.   Take steps to manage and control your weight.     Get regular exercise. Aim for 150 minutes of moderate-intensity exercise (such as walking or yoga) or 75 minutes of vigorous exercise (such as running or swimming) each week.   Manage other health problems, such as diabetes or high blood pressure. Ask your health care provider how you can manage these conditions.  General instructions   Do not drive for 24 hours after your procedure if you were given a medicine to help you relax (sedative).   Take over-the-counter and prescription medicines only as told by your health care provider.   Avoid putting pressure on the area where the pacemaker was placed.   If you need an MRI after your pacemaker has been placed, be sure to tell the health care provider who orders the MRI that you have a pacemaker.   Avoid close and prolonged exposure to electrical devices that have strong  magnetic fields. These include:  ? Cell phones. Avoid keeping them in a pocket near the pacemaker, and try using the ear opposite the pacemaker.  ? MP3 players.  ? Household appliances, like microwaves.  ? Metal detectors.  ? Electric generators.  ? High-tension wires.   Keep all follow-up visits as directed by your health care provider. This is important.  Contact a health care provider if:   You have pain at the incision site that is not relieved by over-the-counter or prescription medicines.   You have any of these around your incision site or coming from it:  ? More redness, swelling, or pain.  ? Fluid or blood.  ? Warmth to the touch.  ? Pus or a bad smell.   You have a fever.   You feel brief, occasional palpitations, light-headedness, or any symptoms that you think might be related to your heart.  Get help right away if:   You experience chest pain that is different from the pain at the pacemaker site.   You develop a red streak that extends above or below the incision site.   You experience shortness of breath.   You have palpitations or an irregular heartbeat.   You have light-headedness that does not go away quickly.   You faint or have dizzy spells.   Your pulse suddenly drops or increases rapidly and does not return to normal.   You begin to gain weight and your legs and ankles swell.  Summary   After your procedure, it is common to have pain, soreness, and some swelling where the pacemaker was inserted.   Make sure to keep your incision clean and dry. Follow instructions from your health care provider about how to take care of your incision.   Check your incision every day for signs of infection, such as more pain or swelling, pus or a bad smell, warmth, or leaking fluid and blood.   Avoid strenuous exercise and lifting your left arm higher than your shoulder for 2 weeks, or as long as told by your health care provider.  This information is not intended to replace advice given to you by  your health care provider. Make sure you discuss any questions you have with your health care provider.  Document Released: 10/27/2012 Document Revised: 11/29/2015 Document Reviewed: 11/29/2015  Elsevier Interactive Patient Education  2019 [employer]

## 2018-04-03 NOTE — Interval H&P Note (Signed)
History and Physical Interval Note:  04/03/2018 3:01 PM  Janet Everett  has presented today for surgery, with the diagnosis of dislocated.  The various methods of treatment have been discussed with the patient and family. After consideration of risks, benefits and other options for treatment, the patient has consented to  Procedure(s): POCKET REVISION/RELOCATION (N/A) as a surgical intervention.  The patient's history has been reviewed, patient examined, no change in status, stable for surgery.  I have reviewed the patient's chart and labs.  Questions were answered to the patient's satisfaction.     Sherryl Manges

## 2018-04-05 ENCOUNTER — Encounter (HOSPITAL_COMMUNITY): Payer: Self-pay | Admitting: Internal Medicine

## 2018-04-06 ENCOUNTER — Telehealth: Payer: Self-pay

## 2018-04-06 MED FILL — Cefazolin Sodium-Dextrose IV Solution 2 GM/100ML-4%: INTRAVENOUS | Qty: 100 | Status: AC

## 2018-04-06 NOTE — Telephone Encounter (Signed)
LMTCB

## 2018-04-06 NOTE — Telephone Encounter (Signed)
-----   Message from Duke Salvia, MD sent at 04/03/2018  4:37 PM EDT ----- Please Inform Patient that study   is abnormal and will require further eval Have a bakers cyst in the knee and will need you to followup with yuur pcp about this  Thanks

## 2018-04-15 ENCOUNTER — Ambulatory Visit (INDEPENDENT_AMBULATORY_CARE_PROVIDER_SITE_OTHER): Payer: Medicare HMO | Admitting: *Deleted

## 2018-04-15 ENCOUNTER — Other Ambulatory Visit: Payer: Self-pay

## 2018-04-15 DIAGNOSIS — Z950 Presence of cardiac pacemaker: Secondary | ICD-10-CM | POA: Diagnosis not present

## 2018-04-15 DIAGNOSIS — R001 Bradycardia, unspecified: Secondary | ICD-10-CM | POA: Diagnosis not present

## 2018-04-16 LAB — CUP PACEART INCLINIC DEVICE CHECK
Battery Remaining Longevity: 121 mo
Brady Statistic RA Percent Paced: 97 %
Brady Statistic RV Percent Paced: 0.77 %
Date Time Interrogation Session: 20200326140550
Implantable Lead Implant Date: 20180312
Implantable Lead Implant Date: 20180312
Implantable Lead Location: 753859
Implantable Lead Location: 753860
Implantable Pulse Generator Implant Date: 20180312
Lead Channel Impedance Value: 475 Ohm
Lead Channel Impedance Value: 637.5 Ohm
Lead Channel Pacing Threshold Amplitude: 0.625 V
Lead Channel Pacing Threshold Pulse Width: 0.5 ms
Lead Channel Pacing Threshold Pulse Width: 0.5 ms
Lead Channel Sensing Intrinsic Amplitude: 12 mV
Lead Channel Sensing Intrinsic Amplitude: 5 mV
Lead Channel Setting Pacing Amplitude: 0.875
Lead Channel Setting Pacing Pulse Width: 0.5 ms
Lead Channel Setting Sensing Sensitivity: 2 mV
MDC IDC MSMT BATTERY VOLTAGE: 2.99 V
MDC IDC MSMT LEADCHNL RV PACING THRESHOLD AMPLITUDE: 0.625 V
MDC IDC SET LEADCHNL RA PACING AMPLITUDE: 1.625
Pulse Gen Model: 2272
Pulse Gen Serial Number: 8005635

## 2018-04-16 NOTE — Progress Notes (Signed)
Device Clinic PPM wound check appointment, s/p pocket revision. Dermabond removed. Wound without redness or edema. Incision edges approximated, wound well healed. Normal device function. Thresholds, sensing, and impedances consistent with implant measurements. Autocapture programmed on (per chronic settings). Histogram distribution appropriate for patient and level of activity. No mode switches or high ventricular rates noted. HVR detection increased to 175bpm per clinic protocol. Patient educated about wound care, arm mobility, and Merlin monitor. ROV with SK in 10/2018 as scheduled.

## 2018-04-28 ENCOUNTER — Other Ambulatory Visit: Payer: Self-pay | Admitting: Internal Medicine

## 2018-04-28 MED ORDER — ATORVASTATIN CALCIUM 40 MG PO TABS: ORAL_TABLET | ORAL | 3 refills | Status: AC

## 2018-05-05 ENCOUNTER — Ambulatory Visit (INDEPENDENT_AMBULATORY_CARE_PROVIDER_SITE_OTHER): Payer: Medicare HMO | Admitting: *Deleted

## 2018-05-05 ENCOUNTER — Other Ambulatory Visit: Payer: Self-pay

## 2018-05-05 DIAGNOSIS — I495 Sick sinus syndrome: Secondary | ICD-10-CM

## 2018-05-05 DIAGNOSIS — R001 Bradycardia, unspecified: Secondary | ICD-10-CM

## 2018-05-05 LAB — CUP PACEART REMOTE DEVICE CHECK
Battery Remaining Longevity: 124 mo
Battery Remaining Percentage: 95.5 %
Battery Voltage: 2.99 V
Brady Statistic AP VP Percent: 1 %
Brady Statistic AP VS Percent: 98 %
Brady Statistic AS VP Percent: 1 %
Brady Statistic AS VS Percent: 1.4 %
Brady Statistic RA Percent Paced: 98 %
Brady Statistic RV Percent Paced: 1 %
Date Time Interrogation Session: 20200415080012
Implantable Lead Implant Date: 20180312
Implantable Lead Implant Date: 20180312
Implantable Lead Location: 753859
Implantable Lead Location: 753860
Implantable Pulse Generator Implant Date: 20180312
Lead Channel Impedance Value: 460 Ohm
Lead Channel Impedance Value: 630 Ohm
Lead Channel Pacing Threshold Amplitude: 0.625 V
Lead Channel Pacing Threshold Amplitude: 0.625 V
Lead Channel Pacing Threshold Pulse Width: 0.5 ms
Lead Channel Pacing Threshold Pulse Width: 0.5 ms
Lead Channel Sensing Intrinsic Amplitude: 12 mV
Lead Channel Sensing Intrinsic Amplitude: 4.9 mV
Lead Channel Setting Pacing Amplitude: 0.875
Lead Channel Setting Pacing Amplitude: 1.625
Lead Channel Setting Pacing Pulse Width: 0.5 ms
Lead Channel Setting Sensing Sensitivity: 2 mV
Pulse Gen Model: 2272
Pulse Gen Serial Number: 8005635

## 2018-05-11 NOTE — Progress Notes (Signed)
Remote pacemaker transmission.   

## 2018-05-11 NOTE — Progress Notes (Signed)
letter

## 2018-07-11 ENCOUNTER — Other Ambulatory Visit: Payer: Self-pay | Admitting: Physician Assistant

## 2018-08-04 ENCOUNTER — Ambulatory Visit (INDEPENDENT_AMBULATORY_CARE_PROVIDER_SITE_OTHER): Payer: Medicare HMO | Admitting: *Deleted

## 2018-08-04 DIAGNOSIS — I495 Sick sinus syndrome: Secondary | ICD-10-CM | POA: Diagnosis not present

## 2018-08-04 LAB — CUP PACEART REMOTE DEVICE CHECK
Date Time Interrogation Session: 20200715132739
Implantable Lead Implant Date: 20180312
Implantable Lead Implant Date: 20180312
Implantable Lead Location: 753859
Implantable Lead Location: 753860
Implantable Pulse Generator Implant Date: 20180312
Pulse Gen Model: 2272
Pulse Gen Serial Number: 8005635

## 2018-08-13 ENCOUNTER — Encounter: Payer: Self-pay | Admitting: Cardiology

## 2018-08-13 NOTE — Progress Notes (Signed)
Remote pacemaker transmission.   

## 2018-09-21 IMAGING — MG 2D DIGITAL DIAGNOSTIC UNILATERAL RIGHT MAMMOGRAM WITH CAD AND AD
6 of 9 series · 6 of 21 positions shown · non-contrast
Comparison: Previous exam(s).

CLINICAL DATA: 80-year-old female presenting for evaluation of a
palpable right breast mass with tenderness.

EXAM:
2D DIGITAL DIAGNOSTIC UNILATERAL RIGHT MAMMOGRAM WITH CAD AND
ADJUNCT TOMO
RIGHT BREAST ULTRASOUND

[R MLO synth-2D]
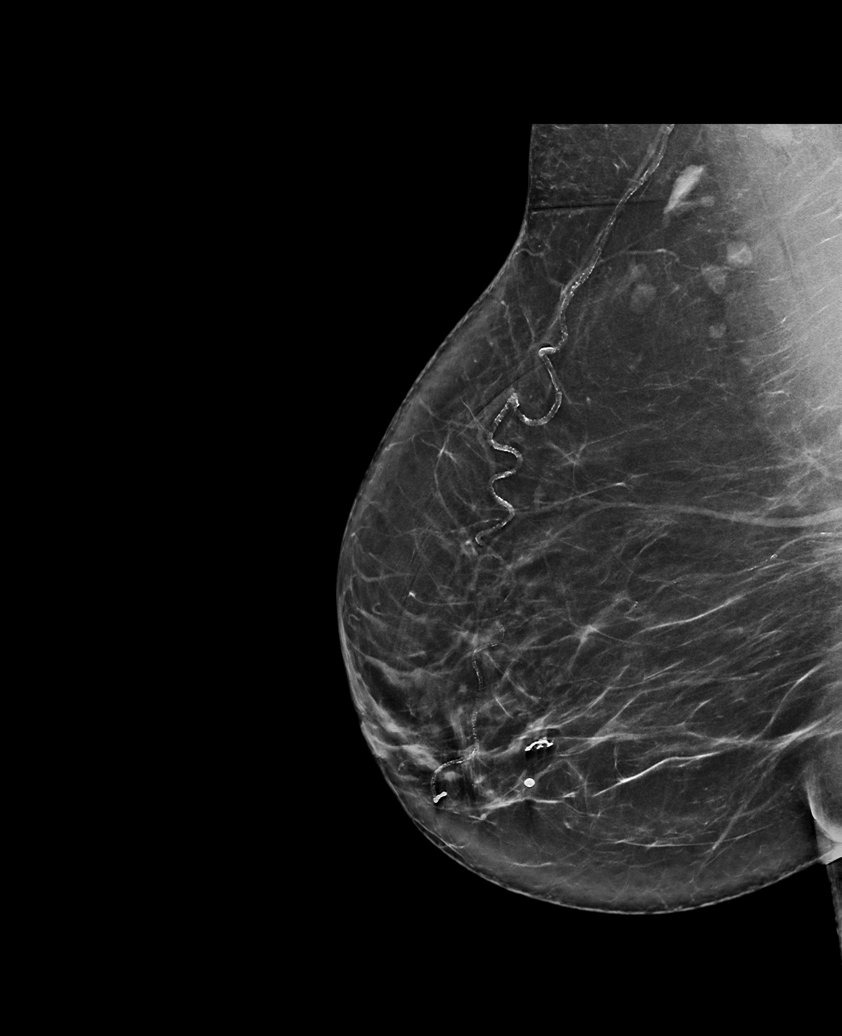

[R CC]
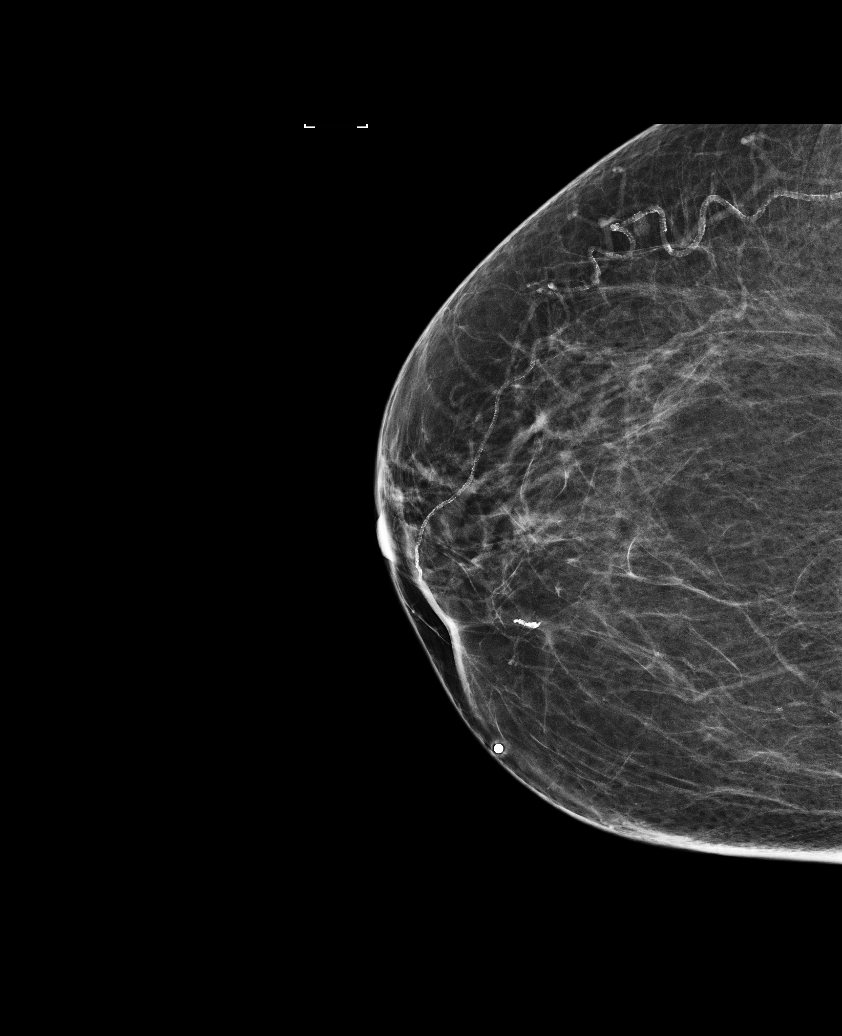

[R TAN synth-2D]
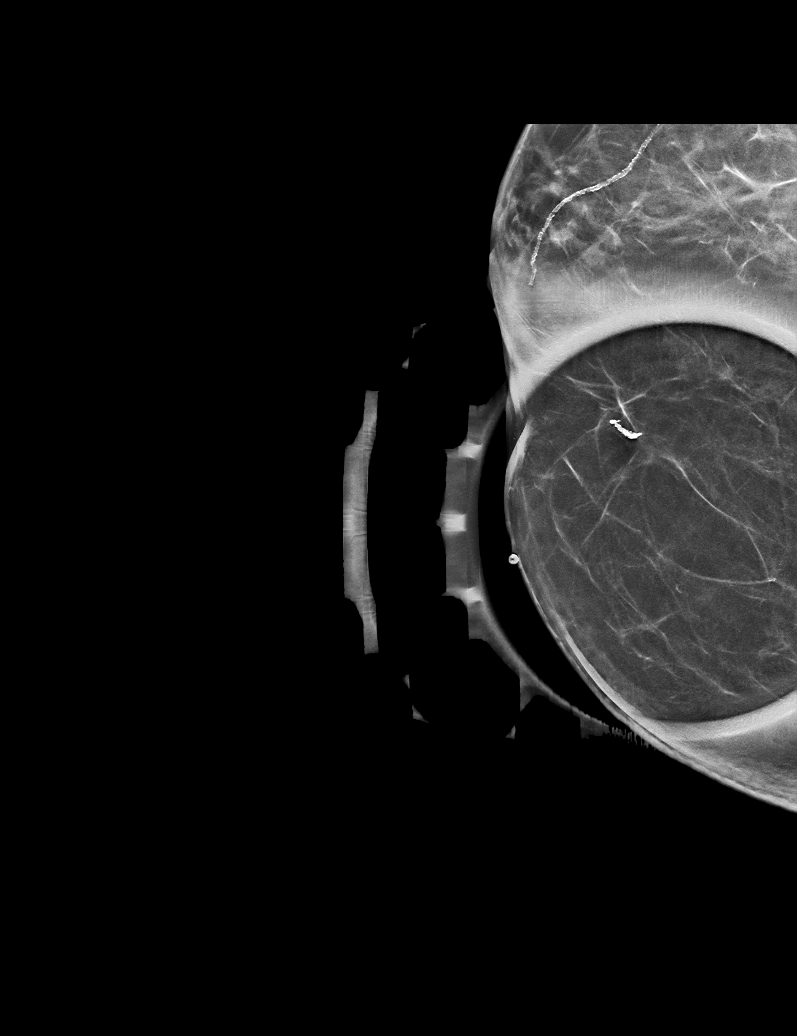

[R MLO]
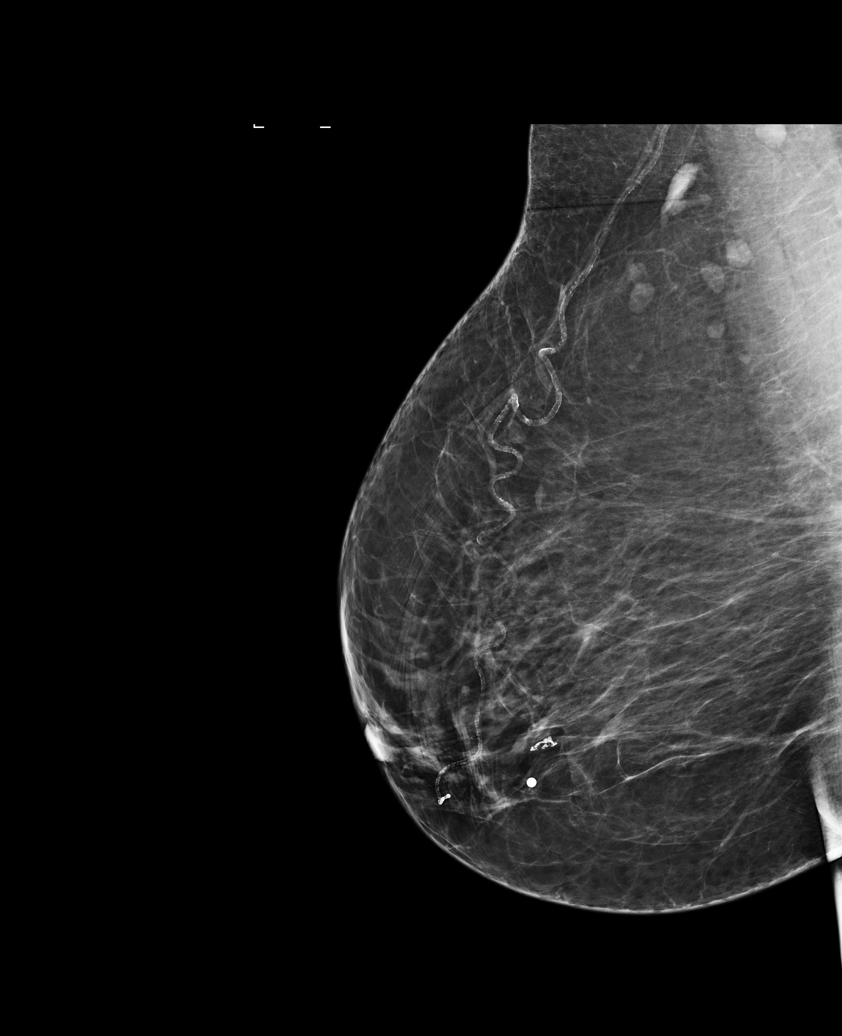

[R TAN]
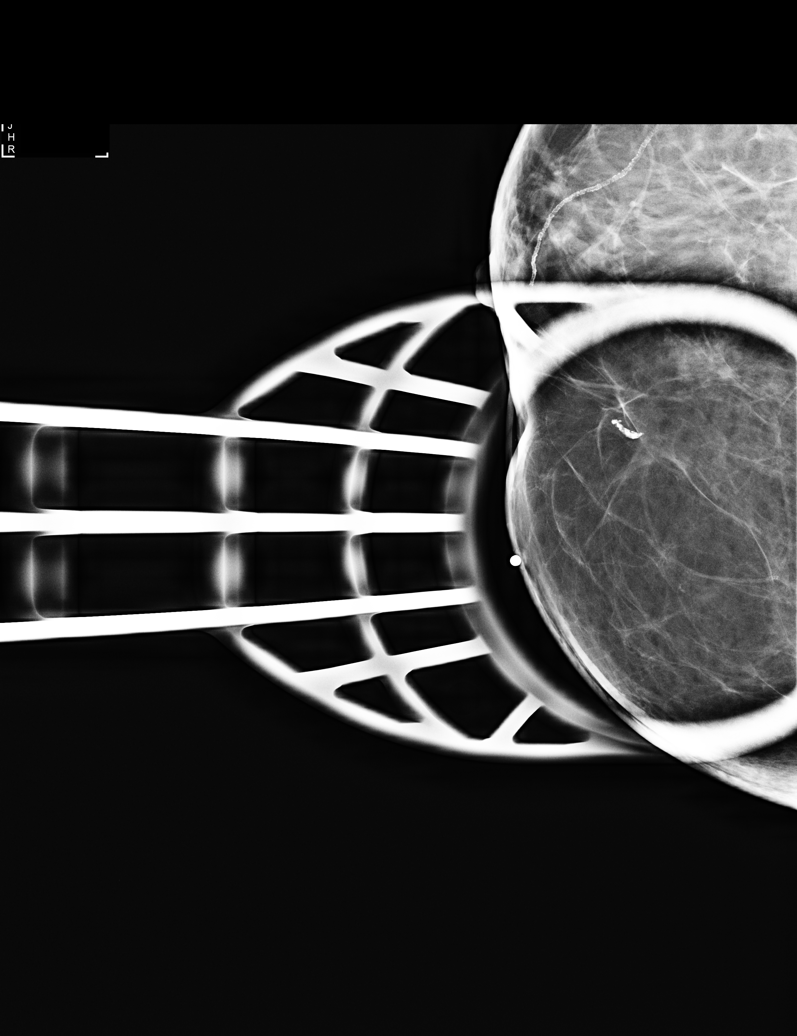

[R CC synth-2D]
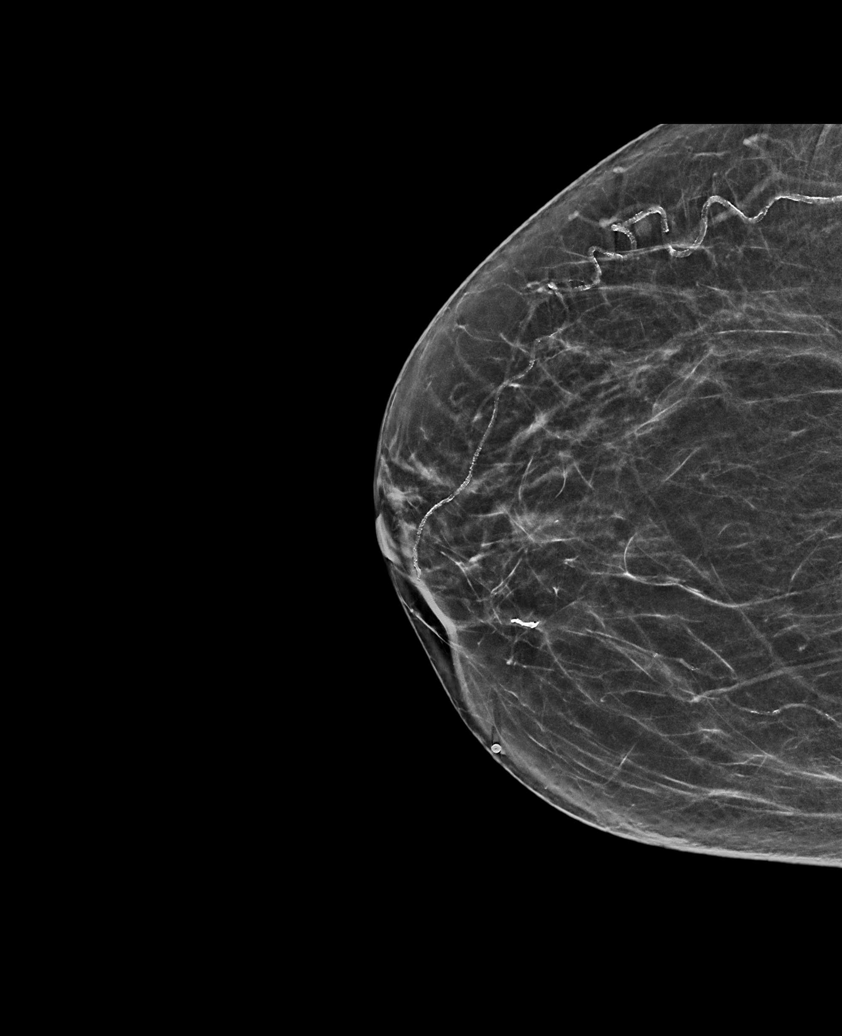

[6 of 21 positions shown; findings below may reference images not displayed]

ACR Breast Density Category b: There are scattered areas of
fibroglandular density.
FINDINGS: A BB has been placed on the medial aspect of the right breast
indicating the palpable site of concern. Dystrophic calcifications
are identified deep to the palpable marker, however these are likely
too small to account for the patient's palpable.

Mammographic images were processed with CAD.

Ultrasound targeted to the medial aspect of the right breast
demonstrates normal fibroglandular tissue. No masses or suspicious
areas of shadowing are identified.
IMPRESSION: 1. There are no mammographic or targeted sonographic abnormalities
at the palpable tender site of concern in the right breast.

RECOMMENDATION:
1. Clinical follow-up recommended for the tender palpable area of
concern in the right breast. Any further workup should be based on
clinical grounds.

2. Return to routine screening mammography is recommended. The
patient will be due for screening in Saturday May, 2017.

I have discussed the findings and recommendations with the patient.
Results were also provided in writing at the conclusion of the
visit. If applicable, a reminder letter will be sent to the patient
regarding the next appointment.

BI-RADS CATEGORY  1: Negative.

## 2018-11-03 ENCOUNTER — Ambulatory Visit (INDEPENDENT_AMBULATORY_CARE_PROVIDER_SITE_OTHER): Payer: Medicare HMO | Admitting: *Deleted

## 2018-11-03 DIAGNOSIS — I495 Sick sinus syndrome: Secondary | ICD-10-CM | POA: Diagnosis not present

## 2018-11-03 LAB — CUP PACEART REMOTE DEVICE CHECK
Battery Remaining Longevity: 124 mo
Battery Remaining Percentage: 95.5 %
Battery Voltage: 2.99 V
Brady Statistic AP VP Percent: 1 %
Brady Statistic AP VS Percent: 97 %
Brady Statistic AS VP Percent: 1 %
Brady Statistic AS VS Percent: 2.7 %
Brady Statistic RA Percent Paced: 97 %
Brady Statistic RV Percent Paced: 1 %
Date Time Interrogation Session: 20201014080013
Implantable Lead Implant Date: 20180312
Implantable Lead Implant Date: 20180312
Implantable Lead Location: 753859
Implantable Lead Location: 753860
Implantable Pulse Generator Implant Date: 20180312
Lead Channel Impedance Value: 480 Ohm
Lead Channel Impedance Value: 610 Ohm
Lead Channel Pacing Threshold Amplitude: 0.625 V
Lead Channel Pacing Threshold Amplitude: 0.75 V
Lead Channel Pacing Threshold Pulse Width: 0.5 ms
Lead Channel Pacing Threshold Pulse Width: 0.5 ms
Lead Channel Sensing Intrinsic Amplitude: 12 mV
Lead Channel Sensing Intrinsic Amplitude: 5 mV
Lead Channel Setting Pacing Amplitude: 0.875
Lead Channel Setting Pacing Amplitude: 1.75 V
Lead Channel Setting Pacing Pulse Width: 0.5 ms
Lead Channel Setting Sensing Sensitivity: 2 mV
Pulse Gen Model: 2272
Pulse Gen Serial Number: 8005635

## 2018-11-09 NOTE — Progress Notes (Signed)
Cardiology Office Note Date:  11/15/2018  Patient ID:  Janet GildingLeeonia Everett, DOB 10/16/1935, MRN [id number] PCP:  Karle PlumberArvind, Moogali M, MD  Cardiologist:  Dr. Elease HashimotoNahser (new March 2018 hospitalization only) Electrophysiologist: Dr. Graciela HusbandsKlein (new)   Chief Complaint: routine visit  History of Present Illness: Janet RiggerLeeonia Everett is a 83 y.o. 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 Graciela HusbandsKlein, CAD, HTN.  In review of her hospital notes, there is report of a cardiac cath in [place] a year prior with stable CAD, she had reported of an element of CP associated with RAFib,with mildly elevated Trop,  this was felt by Dr. Graciela HusbandsKlein given reports of similar symptoms with cath reported that this was demand ischemia 2/2 her rapid AF.  She underwent PPM implant and discharged on CCB for rate management\s with plans to titrate as needed, as well as aldactone for BP/hypokalemia treatment, f/u BMET was stable.  She comes in today to be seen for Dr. Graciela HusbandsKlein, last seen by him in march, at that time the patient had c/o the PPM can being mobile in the pocket and at times quite bothersome and elected to have the device re-anchored.  She is doing well, no CP, palpitations or SOB, no syncope.  She mentions when walking she get pain in her L groin that radiates to her thigh.  No calf pain, no temp changes or LE wounds.  She reports that her PMD stopped her Xarelto, she said he told her she didn't need it any more.  She denies having had any bleeding issues, no ulcers, no blood in her urin/stool, no bleeding or side effects.   Device information:  SJM dual chamber PPM, implanted 03/31/16, Dr. Graciela HusbandsKlein, tachy-brady 03/30/2018: pocket opened to re-anchor the device that had become somewhat mobile in the pocket and nuisance to the patient   Past Medical History:  Diagnosis Date  . Atrial fibrillation and flutter (HCC)   . Essential hypertension 04/01/2016  .  Lightheadedness/presyncope 04/01/2016    Past Surgical History:  Procedure Laterality Date  . BREAST CYST ASPIRATION Right age 83  . BREAST CYST EXCISION Right    age 83 and 252010  . BREAST EXCISIONAL BIOPSY Right 2011  . PACEMAKER IMPLANT N/A 03/31/2016   Procedure: Pacemaker Implant;  Surgeon: Duke SalviaSteven C Klein, MD;  [facility] CV LAB;  Service: Cardiovascular;  Laterality: N/A;  . POCKET REVISION/RELOCATION N/A 04/02/2018   Procedure: POCKET REVISION/RELOCATION;  Surgeon: Duke SalviaKlein, Steven C, MD;  [facility];  Service: Cardiovascular;  Laterality: N/A;    Current Outpatient Medications  Medication Sig Dispense Refill  . acetaminophen (TYLENOL) 500 MG tablet Take 1,000 mg by mouth daily as needed for moderate pain or headache.    . ALPRAZolam (XANAX) 0.5 MG tablet Take 0.5 mg by mouth daily as needed for anxiety.     . Aromatic Inhalants (VICKS VAPOINHALER IN) Inhale 1 puff into the lungs daily as needed (congestion).    Marland Kitchen. atorvastatin (LIPITOR) 40 MG tablet TAKE 1 TABLET(40 MG) BY MOUTH DAILY 90 tablet 3  . cholecalciferol (VITAMIN D3) 25 MCG (1000 UT) tablet Take 1,000 Units by mouth 4 (four) times a week.    . diltiazem (CARDIZEM CD) 120 MG 24 hr capsule Take 1 capsule (120 mg total) by mouth daily. 30 capsule 9  . furosemide (LASIX) 20 MG tablet Take 1 tablet (20 mg total) by mouth as needed for fluid. 30  tablet 3  . Ketotifen Fumarate (ALLERGY EYE DROPS OP) Place 1 drop into both eyes daily as needed (allergies).    . magnesium hydroxide (MILK OF MAGNESIA) 400 MG/5ML suspension Take 30 mLs by mouth daily as needed for mild constipation.    . Multiple Vitamin (MULTIVITAMIN WITH MINERALS) TABS tablet Take 1 tablet by mouth daily.    . nitroGLYCERIN (NITROSTAT) 0.4 MG SL tablet Place 1 tablet (0.4 mg total) under the tongue every 5 (five) minutes as needed for chest pain. 25 tablet 3  . spironolactone (ALDACTONE) 25 MG tablet TAKE 1 TABLET BY MOUTH DAILY 30 tablet 0    No current facility-administered medications for this visit.     Allergies:   Patient has no known allergies.   Social History:  The patient  reports that she has quit smoking. Her smoking use included cigarettes. She has quit using smokeless tobacco. She reports that she does not drink alcohol or use drugs.   Family History:  The patient's family history includes Breast cancer in her maternal aunt; Hypertension in her mother.  ROS:  Please see the history of present illness.  All other systems are reviewed and otherwise negative.   PHYSICAL EXAM:  VS:  BP 126/80   Pulse 70   Ht 5\' 2"  (1.575 m)   Wt 153 lb 3.2 oz (69.5 kg)   SpO2 98%   BMI 28.02 kg/m  BMI: Body mass index is 28.02 kg/m. Well nourished, well developed, in no acute distress  HEENT: normocephalic, atraumatic  Neck: no JVD, carotid bruits or masses Cardiac:  RRR; no significant murmurs, no rubs, or gallops Lungs:  CTA b/l, no wheezing, rhonchi or rales  Abd: soft, nontender MS: no deformity or atrophy Ext: no edema.  LE vascular exam is normal, easily palpated pedal pulses, no skin changes, feet are warm, no cyanosis.  Good femoral pulses as well, abd aorta is palpated, no widened Skin: warm and dry, no rash Neuro:  No gross deficits appreciated Psych: euthymic mood, full affect  PPM, stable, no tethering or discomfort   EKG:  Not done today PPM check done today done today and reviewed by myself: battery and lead measurements are good. AFib burden <1% She has had 2 true AFib episodes, total duration about 2.5 hours AP 97% VP <1%    03/31/16: TTE Study Conclusions - Left ventricle: The cavity size was normal. Wall thickness was   normal. Systolic function was vigorous. The estimated ejection   fraction was in the range of 70% to 75%. Wall motion was normal;   there were no regional wall motion abnormalities. Doppler   parameters are consistent with abnormal left ventricular   relaxation (grade 1  diastolic dysfunction). - Aortic valve: Valve area (VTI): 1.41 cm^2. Valve area (Vmax):   1.19 cm^2. Valve area (Vmean): 1.31 cm^2. - Left atrium: The atrium was normal in size. Volume/bsa, ES,   (1-plane Simpson&'s, A2C): 21.9 ml/m^2.   Recent Labs: 03/30/2018: BUN 17; Creatinine, Ser 1.16; Hemoglobin 12.6; Platelets 216; Potassium 4.5; Sodium 139  No results found for requested labs within last 8760 hours.   CrCl cannot be calculated (Patient's most recent lab result is older than the maximum 21 days allowed.).   Wt Readings from Last 3 Encounters:  11/15/18 153 lb 3.2 oz (69.5 kg)  04/02/18 154 lb (69.9 kg)  03/30/18 157 lb (71.2 kg)     Other studies reviewed: Additional studies/records reviewed today include: summarized above  ASSESSMENT AND PLAN:  1. Tacy-brady w/PPM     Intact function, no programming changes made  2. Paroxysmal Afib     CHA2DS2Vasc is 4, 5 with gender     Very low burden  We revisited AFib and embolic/stroke risk, while she has low burden, her risk score is high Given she had no issues with the Xarelto or bleeding complication, I would resume her on a/c  She is agreeable to resume a/c Her last labs noted Calc CrCl in the 40's, will get BMET/CBC today for new baseline labs, pending her Creat clearance will dose her Xarelto.       3. HTN     Looks good, no changes  4. CAD     no symptoms     On statin, no ASA with OAC     She reports her labs/lipids are managed with her PMD  5. R groin pain     Suspect ? Hip     No exam evidence to suggest any significant vascular disease     She is recommended to see/discuss with her PMD         Disposition: as above, see her back in 6 mo, sooner if needed    Current medicines are reviewed at length with the patient today.  The patient did not have any concerns regarding medicines.  Judith Blonder, PA-C 11/15/2018 10:19 AM     [facility] [address] [place]  [address] [phone] (office)  [phone] (fax)

## 2018-11-15 ENCOUNTER — Other Ambulatory Visit: Payer: Self-pay

## 2018-11-15 ENCOUNTER — Encounter: Payer: Self-pay | Admitting: Physician Assistant

## 2018-11-15 ENCOUNTER — Ambulatory Visit (INDEPENDENT_AMBULATORY_CARE_PROVIDER_SITE_OTHER): Payer: Medicare HMO | Admitting: Physician Assistant

## 2018-11-15 VITALS — BP 126/80 | HR 70 | Ht 62.0 in | Wt 153.2 lb

## 2018-11-15 DIAGNOSIS — I2510 Atherosclerotic heart disease of native coronary artery without angina pectoris: Secondary | ICD-10-CM | POA: Diagnosis not present

## 2018-11-15 DIAGNOSIS — I480 Paroxysmal atrial fibrillation: Secondary | ICD-10-CM | POA: Diagnosis not present

## 2018-11-15 DIAGNOSIS — Z950 Presence of cardiac pacemaker: Secondary | ICD-10-CM | POA: Diagnosis not present

## 2018-11-15 DIAGNOSIS — Z79899 Other long term (current) drug therapy: Secondary | ICD-10-CM

## 2018-11-15 DIAGNOSIS — I10 Essential (primary) hypertension: Secondary | ICD-10-CM

## 2018-11-15 DIAGNOSIS — E785 Hyperlipidemia, unspecified: Secondary | ICD-10-CM

## 2018-11-15 LAB — BASIC METABOLIC PANEL
BUN/Creatinine Ratio: 12 (ref 12–28)
BUN: 13 mg/dL (ref 8–27)
CO2: 23 mmol/L (ref 20–29)
Calcium: 11.1 mg/dL — ABNORMAL HIGH (ref 8.7–10.3)
Chloride: 105 mmol/L (ref 96–106)
Creatinine, Ser: 1.05 mg/dL — ABNORMAL HIGH (ref 0.57–1.00)
GFR calc Af Amer: 58 mL/min/{1.73_m2} — ABNORMAL LOW (ref >59)
GFR calc non Af Amer: 50 mL/min/{1.73_m2} — ABNORMAL LOW (ref >59)
Glucose: 98 mg/dL (ref 65–99)
Potassium: 4.5 mmol/L (ref 3.5–5.2)
Sodium: 140 mmol/L (ref 134–144)

## 2018-11-15 LAB — CBC
Hematocrit: 41.4 % (ref 34.0–46.6)
Hemoglobin: 13.7 g/dL (ref 11.1–15.9)
MCH: 27.7 pg (ref 26.6–33.0)
MCHC: 33.1 g/dL (ref 31.5–35.7)
MCV: 84 fL (ref 79–97)
Platelets: 227 10*3/uL (ref 150–450)
RBC: 4.94 x10E6/uL (ref 3.77–5.28)
RDW: 12.2 % (ref 11.7–15.4)
WBC: 6.9 10*3/uL (ref 3.4–10.8)

## 2018-11-15 NOTE — Progress Notes (Signed)
Remote pacemaker transmission.   

## 2018-11-15 NOTE — Patient Instructions (Signed)
Medication Instructions:   Your physician recommends that you continue on your current medications as directed. Please refer to the Current Medication list given to you today.  *If you need a refill on your cardiac medications before your next appointment, please call your pharmacy*  Lab Work:  You will have labs drawn today: BMET and CBC  If you have labs (blood work) drawn today and your tests are completely normal, you will receive your results only by: Marland Kitchen MyChart Message (if you have MyChart) OR . A paper copy in the mail If you have any lab test that is abnormal or we need to change your treatment, we will call you to review the results.  Testing/Procedures:  None ordered today  Follow-Up: At [facility], you and your health needs are our priority.  As part of our continuing mission to provide you with exceptional heart care, we have created designated Provider Care Teams.  These Care Teams include your primary Cardiologist (physician) and Advanced Practice Providers (APPs -  Physician Assistants and Nurse Practitioners) who all work together to provide you with the care you need, when you need it.  Your next appointment:   6 months  The format for your next appointment:   In Person  Provider:   Tommye Standard, PA-C

## 2018-11-19 ENCOUNTER — Other Ambulatory Visit: Payer: Self-pay | Admitting: *Deleted

## 2018-11-19 MED ORDER — RIVAROXABAN 15 MG PO TABS: 15.0000 mg | ORAL_TABLET | Freq: Every day | ORAL | 6 refills | Status: DC

## 2019-02-02 ENCOUNTER — Ambulatory Visit (INDEPENDENT_AMBULATORY_CARE_PROVIDER_SITE_OTHER): Payer: Medicare HMO | Admitting: *Deleted

## 2019-02-02 DIAGNOSIS — I495 Sick sinus syndrome: Secondary | ICD-10-CM | POA: Diagnosis not present

## 2019-02-03 LAB — CUP PACEART REMOTE DEVICE CHECK
Battery Remaining Longevity: 124 mo
Battery Remaining Percentage: 95.5 %
Battery Voltage: 2.99 V
Brady Statistic AP VP Percent: 1 %
Brady Statistic AP VS Percent: 95 %
Brady Statistic AS VP Percent: 1 %
Brady Statistic AS VS Percent: 4.7 %
Brady Statistic RA Percent Paced: 95 %
Brady Statistic RV Percent Paced: 1 %
Date Time Interrogation Session: 20210113230203
Implantable Lead Implant Date: 20180312
Implantable Lead Implant Date: 20180312
Implantable Lead Location: 753859
Implantable Lead Location: 753860
Implantable Pulse Generator Implant Date: 20180312
Lead Channel Impedance Value: 460 Ohm
Lead Channel Impedance Value: 600 Ohm
Lead Channel Pacing Threshold Amplitude: 0.625 V
Lead Channel Pacing Threshold Amplitude: 0.625 V
Lead Channel Pacing Threshold Pulse Width: 0.5 ms
Lead Channel Pacing Threshold Pulse Width: 0.5 ms
Lead Channel Sensing Intrinsic Amplitude: 12 mV
Lead Channel Sensing Intrinsic Amplitude: 5 mV
Lead Channel Setting Pacing Amplitude: 0.875
Lead Channel Setting Pacing Amplitude: 1.625
Lead Channel Setting Pacing Pulse Width: 0.5 ms
Lead Channel Setting Sensing Sensitivity: 2 mV
Pulse Gen Model: 2272
Pulse Gen Serial Number: 8005635

## 2019-03-17 ENCOUNTER — Ambulatory Visit: Payer: Medicare HMO

## 2019-03-17 ENCOUNTER — Ambulatory Visit: Payer: Medicare HMO | Attending: Internal Medicine

## 2019-03-17 DIAGNOSIS — Z23 Encounter for immunization: Secondary | ICD-10-CM

## 2019-03-17 NOTE — Progress Notes (Signed)
   Covid-19 Vaccination Clinic  Name:  Safira Proffit    MRN: [id number] DOB: February 27, 1935  03/17/2019  Mr. Locatelli was observed post Covid-19 immunization for 15 minutes without incidence. He was provided with Vaccine Information Sheet and instruction to access the V-Safe system.   Mr. Feher was instructed to call 911 with any severe reactions post vaccine: Marland Kitchen Difficulty breathing  . Swelling of your face and throat  . A fast heartbeat  . A bad rash all over your body  . Dizziness and weakness    Immunizations Administered    Name Date Dose VIS Date Route   Pfizer COVID-19 Vaccine 03/17/2019  2:41 PM 0.3 mL 12/31/2018 Intramuscular   Manufacturer: [employer], [employer]   Lot: [id number]   NDC: [address]-9

## 2019-04-12 ENCOUNTER — Ambulatory Visit: Payer: Medicare HMO | Attending: Internal Medicine

## 2019-04-12 DIAGNOSIS — Z23 Encounter for immunization: Secondary | ICD-10-CM

## 2019-04-12 NOTE — Progress Notes (Signed)
   Covid-19 Vaccination Clinic  Name:  Janet Everett    MRN: [id number] DOB: 06/26/1935  04/12/2019  Mr. Janet Everett was observed post Covid-19 immunization for 15 minutes without incident. He was provided with Vaccine Information Sheet and instruction to access the V-Safe system.   Mr. Janet Everett was instructed to call 911 with any severe reactions post vaccine: Marland Kitchen Difficulty breathing  . Swelling of face and throat  . A fast heartbeat  . A bad rash all over body  . Dizziness and weakness   Immunizations Administered    Name Date Dose VIS Date Route   Pfizer COVID-19 Vaccine 04/12/2019  9:25 AM 0.3 mL 12/31/2018 Intramuscular   Manufacturer: [employer], [employer]   Lot: [id number]   NDC: [address]-1

## 2019-04-20 ENCOUNTER — Other Ambulatory Visit: Payer: Self-pay | Admitting: Internal Medicine

## 2019-04-20 DIAGNOSIS — N644 Mastodynia: Secondary | ICD-10-CM

## 2019-04-24 DIAGNOSIS — I4891 Unspecified atrial fibrillation: Secondary | ICD-10-CM | POA: Insufficient documentation

## 2019-04-25 ENCOUNTER — Encounter: Payer: Self-pay | Admitting: Internal Medicine

## 2019-04-25 ENCOUNTER — Ambulatory Visit: Payer: Medicare HMO | Admitting: Internal Medicine

## 2019-04-25 ENCOUNTER — Other Ambulatory Visit: Payer: Self-pay

## 2019-04-25 VITALS — BP 122/84 | HR 66 | Ht 62.0 in | Wt 154.6 lb

## 2019-04-25 DIAGNOSIS — Z950 Presence of cardiac pacemaker: Secondary | ICD-10-CM

## 2019-04-25 DIAGNOSIS — I480 Paroxysmal atrial fibrillation: Secondary | ICD-10-CM | POA: Diagnosis not present

## 2019-04-25 DIAGNOSIS — I495 Sick sinus syndrome: Secondary | ICD-10-CM | POA: Diagnosis not present

## 2019-04-25 MED ORDER — SPIRONOLACTONE 25 MG PO TABS: 12.5000 mg | ORAL_TABLET | Freq: Every day | ORAL | 3 refills | Status: DC

## 2019-04-25 NOTE — Progress Notes (Signed)
Patient Care Team: Karle Plumber, MD as PCP - General (Internal Medicine)   HPI  Janet Everett is a 84 y.o. female Janet Everett in followup for Afib and post termination pauses and sinus node dysfunction for which she underwent pacing 3/`18  She has hx of CAD with prior stenting. Has Hypertension  Anticoagulated with her Xarelto  Date Cr Hgb  3/18 0.98 13.6  11/18 0.79 13.2  10/20 1.05 13.7    Over recent months that she has had problems with orthostatic lightheadedness.  Her PMH suggest that has been in the more remote past as well  These events typically occur in the morning following taking her a.m. medications.  No chest pain or shortness of breath.  No nocturnal dyspnea or peripheral edema     Past Medical History:  Diagnosis Date  . Atrial fibrillation and flutter (HCC)   . Essential hypertension 04/01/2016  . Lightheadedness/presyncope 04/01/2016    Past Surgical History:  Procedure Laterality Date  . BREAST CYST ASPIRATION Right age 59  . BREAST CYST EXCISION Right    age 73 and 63  . BREAST EXCISIONAL BIOPSY Right 2011  . PACEMAKER IMPLANT N/A 03/31/2016   Procedure: Pacemaker Implant;  Surgeon: Duke Salvia, MD;  Location: [facility] INVASIVE CV LAB;  Service: Cardiovascular;  Laterality: N/A;  . POCKET REVISION/RELOCATION N/A 04/02/2018   Procedure: POCKET REVISION/RELOCATION;  Surgeon: Duke Salvia, MD;  Location: [facility] INVASIVE CV LAB;  Service: Cardiovascular;  Laterality: N/A;    Current Outpatient Medications  Medication Sig Dispense Refill  . acetaminophen (TYLENOL) 500 MG tablet Take 1,000 mg by mouth daily as needed for moderate pain or headache.    . ALPRAZolam (XANAX) 0.5 MG tablet Take 0.5 mg by mouth daily as needed for anxiety.     . Aromatic Inhalants (VICKS VAPOINHALER IN) Inhale 1 puff into the lungs daily as needed (congestion).    Marland Kitchen atorvastatin (LIPITOR) 40 MG tablet TAKE 1 TABLET(40 MG) BY MOUTH DAILY 90 tablet 3  . cholecalciferol  (VITAMIN D3) 25 MCG (1000 UT) tablet Take 1,000 Units by mouth 4 (four) times a week.    . diltiazem (CARDIZEM CD) 120 MG 24 hr capsule Take 1 capsule (120 mg total) by mouth daily. 30 capsule 9  . furosemide (LASIX) 20 MG tablet Take 1 tablet (20 mg total) by mouth as needed for fluid. 30 tablet 3  . Ketotifen Fumarate (ALLERGY EYE DROPS OP) Place 1 drop into both eyes daily as needed (allergies).    . magnesium hydroxide (MILK OF MAGNESIA) 400 MG/5ML suspension Take 30 mLs by mouth daily as needed for mild constipation.    . Multiple Vitamin (MULTIVITAMIN WITH MINERALS) TABS tablet Take 1 tablet by mouth daily.    . nitroGLYCERIN (NITROSTAT) 0.4 MG SL tablet Place 1 tablet (0.4 mg total) under the tongue every 5 (five) minutes as needed for chest pain. 25 tablet 3  . Rivaroxaban (XARELTO) 15 MG TABS tablet Take 1 tablet (15 mg total) by mouth daily with supper. 30 tablet 6  . spironolactone (ALDACTONE) 25 MG tablet TAKE 1 TABLET BY MOUTH DAILY 30 tablet 0   No current facility-administered medications for this visit.    No Known Allergies    Review of Systems negative except from HPI and PMH  Physical Exam BP 122/84 (BP Location: Right Arm, Patient Position: Sitting, Cuff Size: Normal)   Pulse 66   Ht 5\' 2"  (1.575 m)   Wt 154 lb 9.6  oz (70.1 kg)   SpO2 99%   BMI 28.28 kg/m  Well developed and well nourished in no acute distress HENT normal Neck supple with JVP-flat Clear Device pocket well healed; without hematoma or erythema.  There is no tethering  Regular rate and rhythm, split S2 positive S4 gallop 2/6 murmur Abd-soft with active BS No Clubbing cyanosis   edema Skin-warm and dry A & Oriented  Grossly normal sensory and motor function  ECG A pacing 66 18/07/42 T wave inversions V2-V6 2, 3, F, 1, L Considerably more evident compared to 3/20 or 4/19   Assessment and  Plan Atrial fibrillation-paroxysmal  Sinus node dysfunction  Pacemaker St  Jude  Hypertension  Asymmetric edema  Orthostatic lightheadedness  Blood pressures well controlled.  With her orthostatic lightheadedness we will decrease her Aldactone from 25--12.5.  As her symptoms are worse following taking her in medications, we will have her take her diltiazem at night.  Heart rate excursion is adequate.  No interval atrial fibrillation of note.  On Anticoagulation;  No bleeding issues      Current medicines are reviewed at length with the patient today .  The patient does not have concerns regarding medicines.

## 2019-04-25 NOTE — Patient Instructions (Signed)
Medication Instructions:  Your physician has recommended you make the following change in your medication:   ** Begin taking your Diltiazem at night.  ** Begin taking Spironolactone 25mg  1/2 tablet by mouth daily.   Labwork: None ordered.  Testing/Procedures: None ordered.  Follow-Up: Your physician wants you to follow-up in: 12 months with Dr will receive a reminder letter in the mail two months in advance. If you don't receive a letter, please call our office to schedule the follow-up appointment.  Remote monitoring is used to monitor your Pacemaker of ICD from home. This monitoring reduces the number of office visits required to check your device to one time per year. It allows Logan Bores to keep an eye on the functioning of your device to ensure it is working properly.   Any Other Special Instructions Will Be Listed Below (If Applicable).  If you need a refill on your cardiac medications before your next appointment, please call your pharmacy.

## 2019-05-04 ENCOUNTER — Ambulatory Visit (INDEPENDENT_AMBULATORY_CARE_PROVIDER_SITE_OTHER): Payer: Medicare HMO | Admitting: *Deleted

## 2019-05-04 DIAGNOSIS — I495 Sick sinus syndrome: Secondary | ICD-10-CM | POA: Diagnosis not present

## 2019-05-05 ENCOUNTER — Telehealth: Payer: Self-pay | Admitting: Internal Medicine

## 2019-05-05 NOTE — Telephone Encounter (Signed)
Pt will send transmission when she gets back from [place] on 4/19, Monday.

## 2019-05-05 NOTE — Telephone Encounter (Signed)
New Message     Pt is calling and says she missed a call.  She says she is in [place] right now and cannot do a transmission from her device      Please call back

## 2019-05-09 ENCOUNTER — Other Ambulatory Visit: Payer: Self-pay

## 2019-05-09 ENCOUNTER — Ambulatory Visit: Payer: Medicare HMO

## 2019-05-09 ENCOUNTER — Ambulatory Visit
Admission: RE | Admit: 2019-05-09 | Discharge: 2019-05-09 | Disposition: A | Discharge status: Home / Self Care | Payer: Medicare HMO | Source: Ambulatory Visit | Attending: Internal Medicine | Admitting: Internal Medicine

## 2019-05-09 DIAGNOSIS — N644 Mastodynia: Secondary | ICD-10-CM

## 2019-05-09 LAB — CUP PACEART REMOTE DEVICE CHECK
Battery Remaining Longevity: 124 mo
Battery Remaining Percentage: 95.5 %
Battery Voltage: 2.99 V
Brady Statistic AP VP Percent: 1 %
Brady Statistic AP VS Percent: 91 %
Brady Statistic AS VP Percent: 1 %
Brady Statistic AS VS Percent: 8.7 %
Brady Statistic RA Percent Paced: 91 %
Brady Statistic RV Percent Paced: 1 %
Date Time Interrogation Session: 20210418224302
Implantable Lead Implant Date: 20180312
Implantable Lead Implant Date: 20180312
Implantable Lead Location: 753859
Implantable Lead Location: 753860
Implantable Pulse Generator Implant Date: 20180312
Lead Channel Impedance Value: 460 Ohm
Lead Channel Impedance Value: 600 Ohm
Lead Channel Pacing Threshold Amplitude: 0.625 V
Lead Channel Pacing Threshold Amplitude: 0.75 V
Lead Channel Pacing Threshold Pulse Width: 0.5 ms
Lead Channel Pacing Threshold Pulse Width: 0.5 ms
Lead Channel Sensing Intrinsic Amplitude: 12 mV
Lead Channel Sensing Intrinsic Amplitude: 4 mV
Lead Channel Setting Pacing Amplitude: 0.875
Lead Channel Setting Pacing Amplitude: 1.75 V
Lead Channel Setting Pacing Pulse Width: 0.5 ms
Lead Channel Setting Sensing Sensitivity: 2 mV
Pulse Gen Model: 2272
Pulse Gen Serial Number: 8005635

## 2019-05-09 NOTE — Telephone Encounter (Signed)
Transmission received 05-08-2019 

## 2019-05-10 ENCOUNTER — Emergency Department (HOSPITAL_COMMUNITY)
Admission: EM | Admit: 2019-05-10 | Discharge: 2019-05-11 | Disposition: A | Discharge status: Home / Self Care | Payer: Medicare HMO | Attending: Emergency Medicine | Admitting: Emergency Medicine

## 2019-05-10 ENCOUNTER — Encounter (HOSPITAL_COMMUNITY): Payer: Self-pay

## 2019-05-10 ENCOUNTER — Other Ambulatory Visit: Payer: Self-pay

## 2019-05-10 DIAGNOSIS — M79606 Pain in leg, unspecified: Secondary | ICD-10-CM | POA: Insufficient documentation

## 2019-05-10 DIAGNOSIS — Z5321 Procedure and treatment not carried out due to patient leaving prior to being seen by health care provider: Secondary | ICD-10-CM | POA: Insufficient documentation

## 2019-05-10 NOTE — ED Triage Notes (Signed)
Patient arrived stating she is having bilateral leg pain that started today. Reports she started taking an antibiotic for a UTI and is unsure if it is related. Reports last dose of Tylenol at 7pm tonight with no relief. Some swelling noted in ankles, patient stating this is normal for her.

## 2019-07-26 ENCOUNTER — Other Ambulatory Visit: Payer: Self-pay | Admitting: *Deleted

## 2019-07-26 MED ORDER — RIVAROXABAN 15 MG PO TABS: 15.0000 mg | ORAL_TABLET | Freq: Every day | ORAL | 5 refills | Status: AC

## 2019-07-26 NOTE — Telephone Encounter (Signed)
Xarelto 15mg  paper refill request received. Pt is 84 years old, weight-70.1kg, Crea-1.05 on 11/15/2018, last seen by Dr. 11/17/2018 on 04/25/2019, Diagnosis-Afib, CrCl-46.43ml/min; Dose is appropriate based on dosing criteria. Will send in refill to requested pharmacy.

## 2019-07-30 ENCOUNTER — Telehealth: Payer: Self-pay | Admitting: Cardiology

## 2019-07-30 NOTE — Telephone Encounter (Signed)
° °  Received page regarding patient having increased palpitations however in speaking with the patient over the phone, she states that her HR will only on occasion rise above her baseline and sustain. Since her call, she has taken her normal dose of Diltiazem 120mg  OP QD. She reports that her symptoms have since subsided. She is currently not home to check her BP or HR. Discussed keeping her dose at 120mg  for now with the anticipation of up-titrating to 222m if HR's sustain. She will the answering service if HR become uncontrolled and she is able to take BP. Also will call office Monday for appointment if symptoms persist.    43m NP-C [facility] Pager: [phone]

## 2019-08-03 ENCOUNTER — Ambulatory Visit (INDEPENDENT_AMBULATORY_CARE_PROVIDER_SITE_OTHER): Payer: Medicare HMO | Admitting: *Deleted

## 2019-08-03 DIAGNOSIS — I495 Sick sinus syndrome: Secondary | ICD-10-CM | POA: Diagnosis not present

## 2019-08-03 LAB — CUP PACEART REMOTE DEVICE CHECK
Battery Remaining Longevity: 124 mo
Battery Remaining Percentage: 95.5 %
Battery Voltage: 2.99 V
Brady Statistic AP VP Percent: 1 %
Brady Statistic AP VS Percent: 94 %
Brady Statistic AS VP Percent: 1 %
Brady Statistic AS VS Percent: 5.5 %
Brady Statistic RA Percent Paced: 94 %
Brady Statistic RV Percent Paced: 1 %
Date Time Interrogation Session: 20210714040023
Implantable Lead Implant Date: 20180312
Implantable Lead Implant Date: 20180312
Implantable Lead Location: 753859
Implantable Lead Location: 753860
Implantable Pulse Generator Implant Date: 20180312
Lead Channel Impedance Value: 450 Ohm
Lead Channel Impedance Value: 600 Ohm
Lead Channel Pacing Threshold Amplitude: 0.625 V
Lead Channel Pacing Threshold Amplitude: 0.75 V
Lead Channel Pacing Threshold Pulse Width: 0.5 ms
Lead Channel Pacing Threshold Pulse Width: 0.5 ms
Lead Channel Sensing Intrinsic Amplitude: 12 mV
Lead Channel Sensing Intrinsic Amplitude: 4.2 mV
Lead Channel Setting Pacing Amplitude: 1 V
Lead Channel Setting Pacing Amplitude: 1.625
Lead Channel Setting Pacing Pulse Width: 0.5 ms
Lead Channel Setting Sensing Sensitivity: 2 mV
Pulse Gen Model: 2272
Pulse Gen Serial Number: 8005635

## 2019-08-04 NOTE — Progress Notes (Signed)
Remote pacemaker transmission.   

## 2019-08-22 LAB — CUP PACEART INCLINIC DEVICE CHECK
Brady Statistic RA Percent Paced: 94 %
Brady Statistic RV Percent Paced: 1 % — CL
Date Time Interrogation Session: 20210802100848
Implantable Lead Implant Date: 20180312
Implantable Lead Implant Date: 20180312
Implantable Lead Location: 753859
Implantable Lead Location: 753860
Implantable Pulse Generator Implant Date: 20180312
Lead Channel Pacing Threshold Amplitude: 0.625 V
Lead Channel Pacing Threshold Amplitude: 0.625 V
Lead Channel Pacing Threshold Pulse Width: 0.5 ms
Lead Channel Pacing Threshold Pulse Width: 0.5 ms
Lead Channel Sensing Intrinsic Amplitude: 12 mV
Lead Channel Sensing Intrinsic Amplitude: 4.5 mV
Pulse Gen Model: 2272
Pulse Gen Serial Number: 8005635

## 2019-11-02 ENCOUNTER — Ambulatory Visit (INDEPENDENT_AMBULATORY_CARE_PROVIDER_SITE_OTHER): Payer: Medicare HMO

## 2019-11-02 DIAGNOSIS — I495 Sick sinus syndrome: Secondary | ICD-10-CM

## 2019-11-04 LAB — CUP PACEART REMOTE DEVICE CHECK
Battery Remaining Longevity: 123 mo
Battery Remaining Percentage: 95.5 %
Battery Voltage: 2.99 V
Brady Statistic AP VP Percent: 1 %
Brady Statistic AP VS Percent: 94 %
Brady Statistic AS VP Percent: 1 %
Brady Statistic AS VS Percent: 4.5 %
Brady Statistic RA Percent Paced: 94 %
Brady Statistic RV Percent Paced: 1 %
Date Time Interrogation Session: 20211013040014
Implantable Lead Implant Date: 20180312
Implantable Lead Implant Date: 20180312
Implantable Lead Location: 753859
Implantable Lead Location: 753860
Implantable Pulse Generator Implant Date: 20180312
Lead Channel Impedance Value: 440 Ohm
Lead Channel Impedance Value: 600 Ohm
Lead Channel Pacing Threshold Amplitude: 0.625 V
Lead Channel Pacing Threshold Amplitude: 0.75 V
Lead Channel Pacing Threshold Pulse Width: 0.5 ms
Lead Channel Pacing Threshold Pulse Width: 0.5 ms
Lead Channel Sensing Intrinsic Amplitude: 12 mV
Lead Channel Sensing Intrinsic Amplitude: 5 mV
Lead Channel Setting Pacing Amplitude: 0.875
Lead Channel Setting Pacing Amplitude: 1.75 V
Lead Channel Setting Pacing Pulse Width: 0.5 ms
Lead Channel Setting Sensing Sensitivity: 2 mV
Pulse Gen Model: 2272
Pulse Gen Serial Number: 8005635

## 2019-11-07 NOTE — Progress Notes (Signed)
Remote pacemaker transmission.   

## 2020-02-01 ENCOUNTER — Ambulatory Visit (INDEPENDENT_AMBULATORY_CARE_PROVIDER_SITE_OTHER): Payer: Medicare HMO

## 2020-02-01 DIAGNOSIS — I495 Sick sinus syndrome: Secondary | ICD-10-CM

## 2020-02-01 LAB — CUP PACEART REMOTE DEVICE CHECK
Battery Remaining Longevity: 124 mo
Battery Remaining Percentage: 95.5 %
Battery Voltage: 2.99 V
Brady Statistic AP VP Percent: 1 %
Brady Statistic AP VS Percent: 95 %
Brady Statistic AS VP Percent: 1 %
Brady Statistic AS VS Percent: 4.2 %
Brady Statistic RA Percent Paced: 95 %
Brady Statistic RV Percent Paced: 1 %
Date Time Interrogation Session: 20220112040015
Implantable Lead Implant Date: 20180312
Implantable Lead Implant Date: 20180312
Implantable Lead Location: 753859
Implantable Lead Location: 753860
Implantable Pulse Generator Implant Date: 20180312
Lead Channel Impedance Value: 460 Ohm
Lead Channel Impedance Value: 630 Ohm
Lead Channel Pacing Threshold Amplitude: 0.625 V
Lead Channel Pacing Threshold Amplitude: 0.75 V
Lead Channel Pacing Threshold Pulse Width: 0.5 ms
Lead Channel Pacing Threshold Pulse Width: 0.5 ms
Lead Channel Sensing Intrinsic Amplitude: 12 mV
Lead Channel Sensing Intrinsic Amplitude: 5 mV
Lead Channel Setting Pacing Amplitude: 0.875
Lead Channel Setting Pacing Amplitude: 1.75 V
Lead Channel Setting Pacing Pulse Width: 0.5 ms
Lead Channel Setting Sensing Sensitivity: 2 mV
Pulse Gen Model: 2272
Pulse Gen Serial Number: 8005635

## 2020-02-14 NOTE — Progress Notes (Signed)
Remote pacemaker transmission.   

## 2020-03-30 IMAGING — CR DG CHEST 2V
2 series · 2 of 2 positions shown · non-contrast
Comparison: 04/01/2016

CLINICAL DATA: Follow-up pacemaker placement

EXAM:
CHEST - 2 VIEW

[w chest pa]
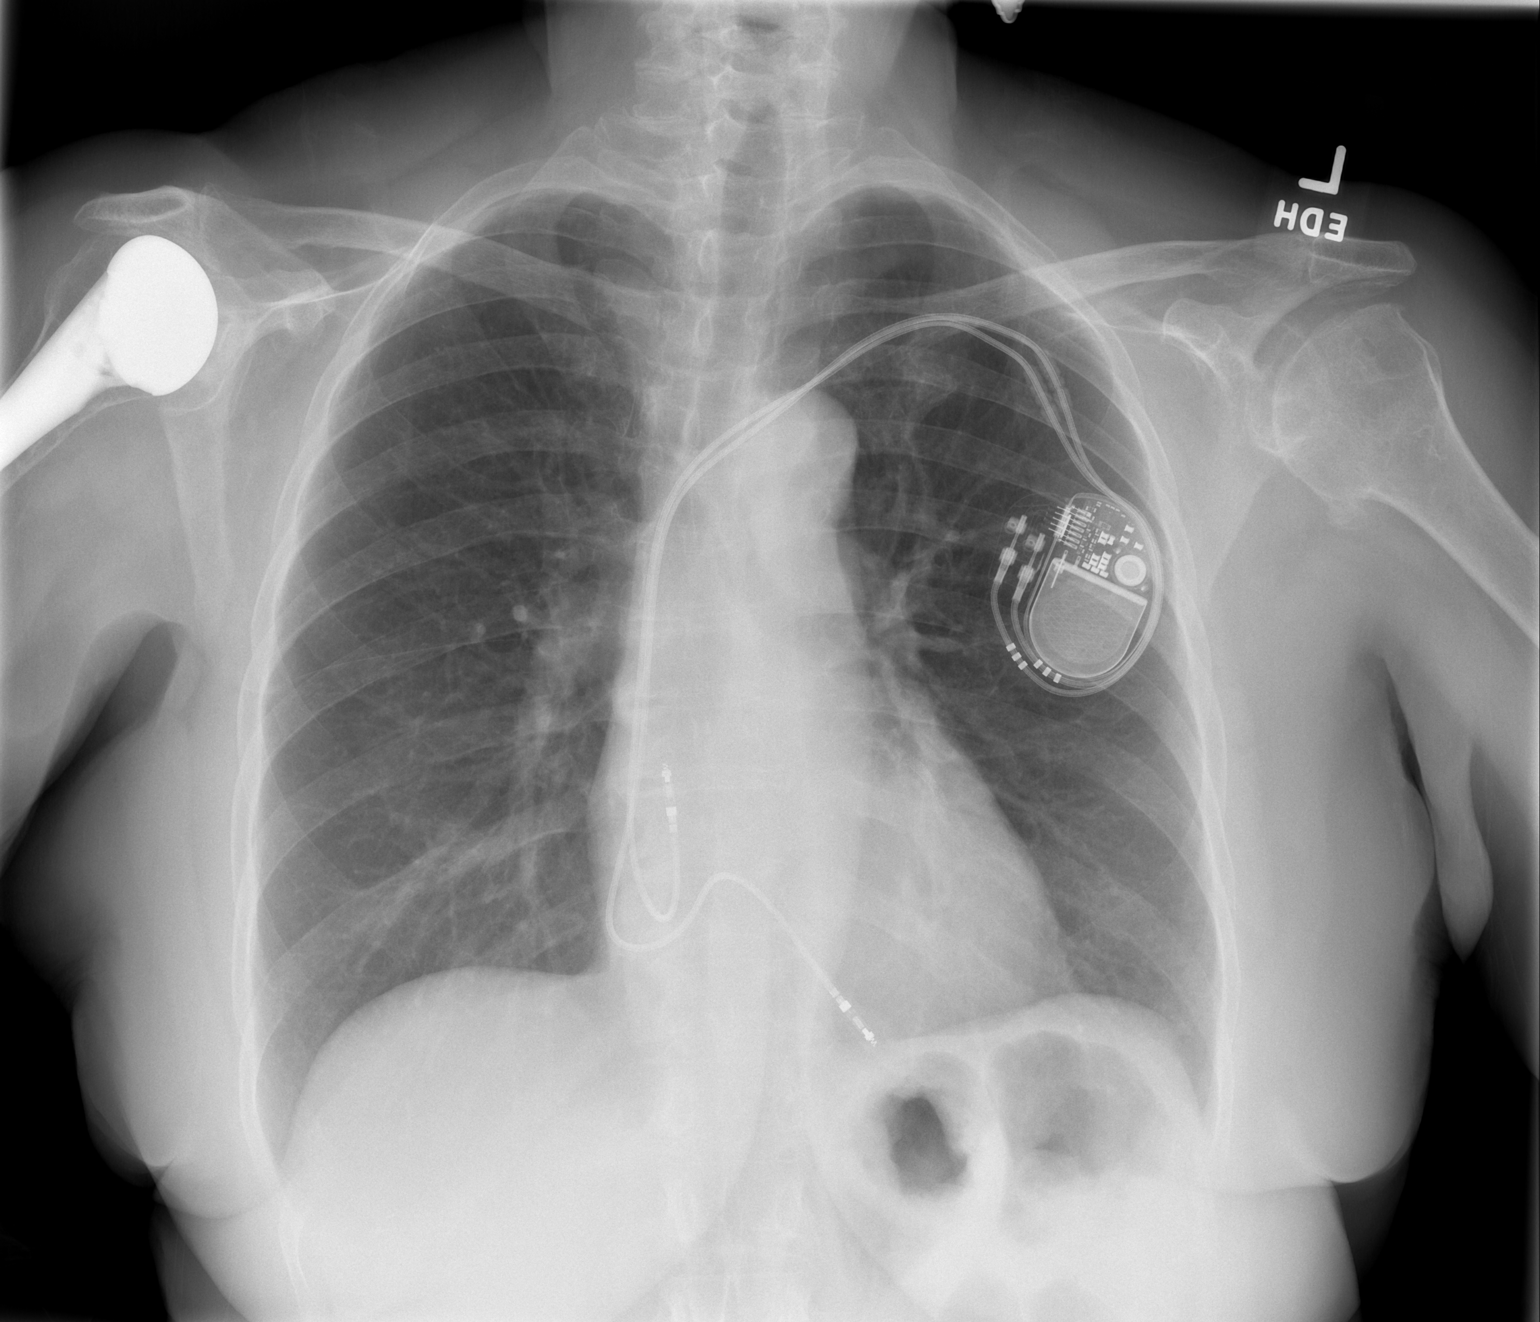

[w chest lat]
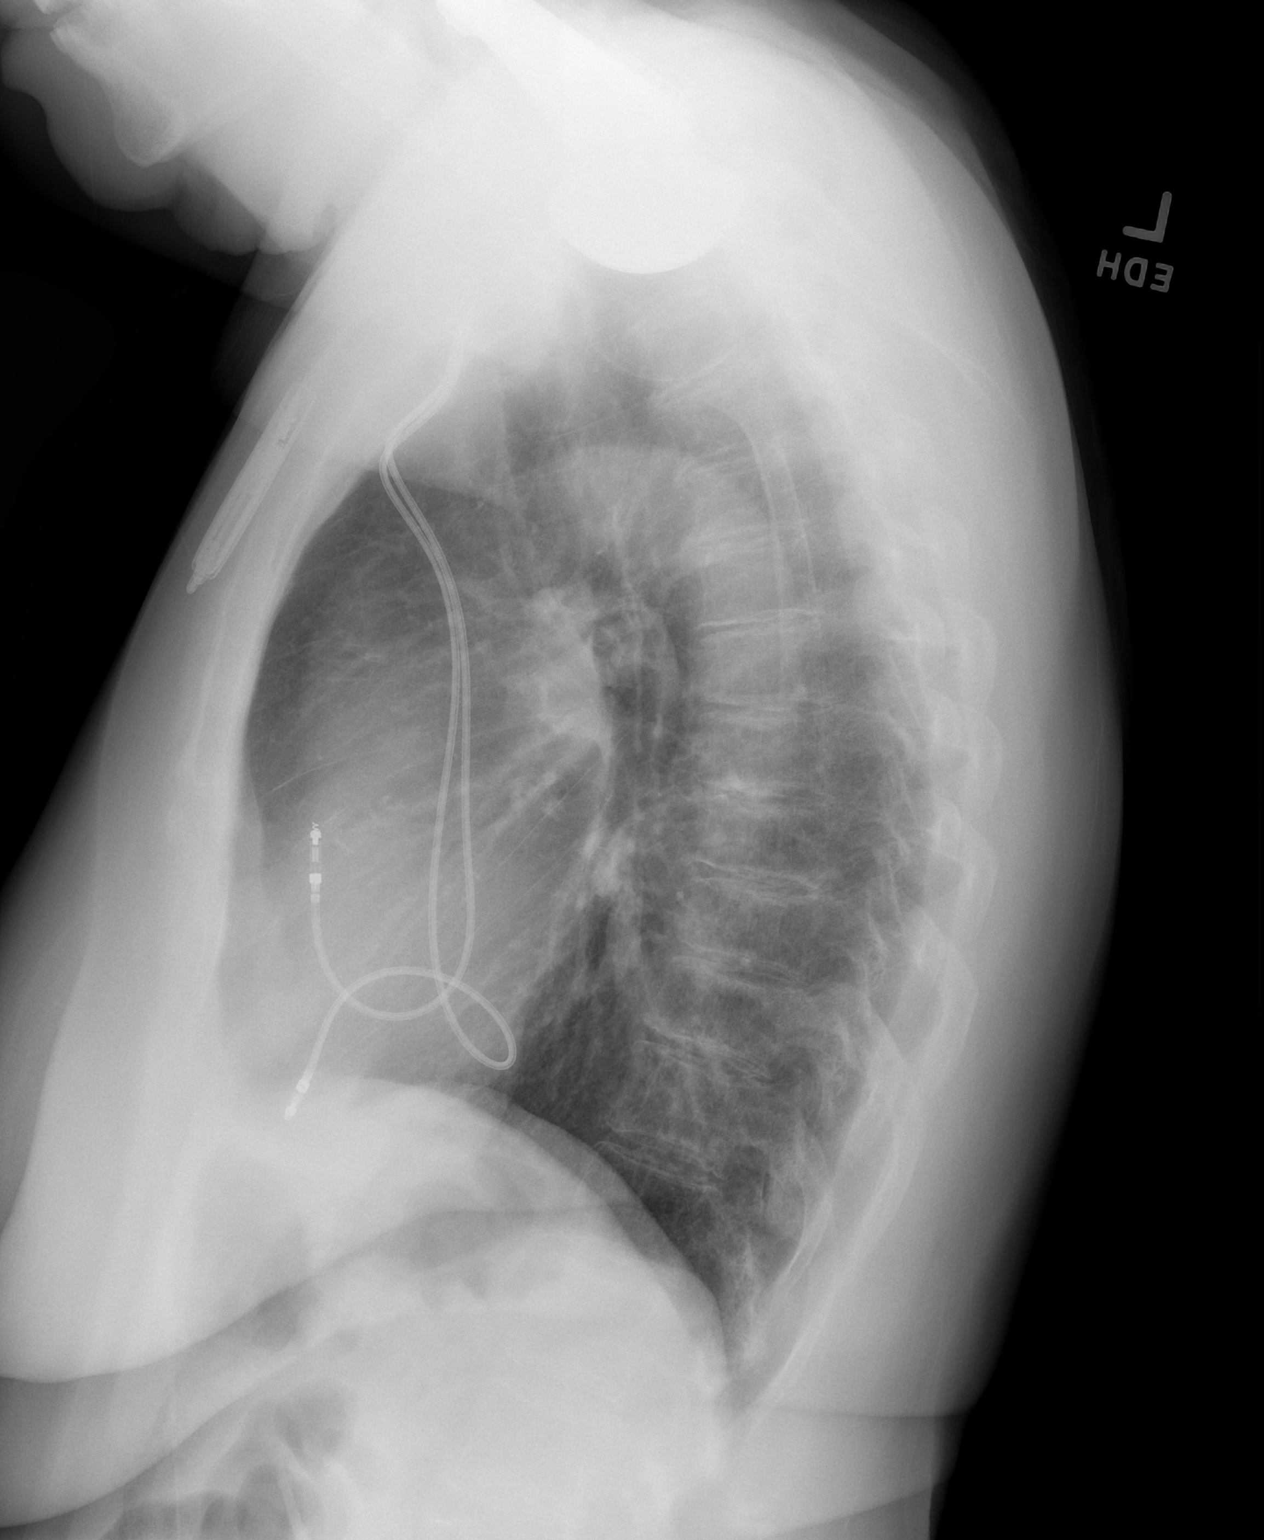

[2 of 2 positions shown; findings below may reference images not displayed]

FINDINGS: Pacing device is again seen and stable. Lungs are clear. Cardiac
shadow is within normal limits. No infiltrates are seen.
Postsurgical changes in the right shoulder are noted.
IMPRESSION: Stable pacer.  No acute abnormality noted.

## 2020-04-04 NOTE — Progress Notes (Unsigned)
Electrophysiology Office Note Date: 04/05/2020  ID:  Janet Everett, DOB 1935-10-22, MRN [id number]  PCP: Karle Plumber, MD Primary Cardiologist: No primary care provider on file. Electrophysiologist: Sherryl Manges, MD   CC: Pacemaker follow-up  Janet Everett is a 85 y.o. female seen today for Sherryl Manges, MD for acute visit due to palpitations.  Since last being seen in our clinic the patient reports doing OK. She has rare, short palpitations that last for several seconds. No clear aggravating or relieving factors.  she denies chest pain, dyspnea, PND, orthopnea, nausea, vomiting, dizziness, syncope, edema, weight gain, or early satiety.  Device History: St. Jude Dual Chamber PPM implanted 03/31/2016 for SND  Past Medical History:  Diagnosis Date  . Atrial fibrillation and flutter (HCC)   . Essential hypertension 04/01/2016  . Lightheadedness/presyncope 04/01/2016   Past Surgical History:  Procedure Laterality Date  . BREAST CYST ASPIRATION Right age 58  . BREAST CYST EXCISION Right    age 69 and 17  . BREAST EXCISIONAL BIOPSY Right 2011  . PACEMAKER IMPLANT N/A 03/31/2016   Procedure: Pacemaker Implant;  Surgeon: Duke Salvia, MD;  Location: [facility] INVASIVE CV LAB;  Service: Cardiovascular;  Laterality: N/A;  . POCKET REVISION/RELOCATION N/A 04/02/2018   Procedure: POCKET REVISION/RELOCATION;  Surgeon: Duke Salvia, MD;  Location: [facility] INVASIVE CV LAB;  Service: Cardiovascular;  Laterality: N/A;    Current Outpatient Medications  Medication Sig Dispense Refill  . acetaminophen (TYLENOL) 500 MG tablet Take 1,000 mg by mouth daily as needed for moderate pain or headache.    . ALPRAZolam (XANAX) 0.5 MG tablet Take 0.5 mg by mouth daily as needed for anxiety.     . Aromatic Inhalants (VICKS VAPOINHALER IN) Inhale 1 puff into the lungs daily as needed (congestion).    Marland Kitchen atorvastatin (LIPITOR) 40 MG tablet TAKE 1 TABLET(40 MG) BY MOUTH DAILY 90 tablet 3  . cholecalciferol  (VITAMIN D3) 25 MCG (1000 UT) tablet Take 1,000 Units by mouth 4 (four) times a week.    . diltiazem (CARDIZEM CD) 120 MG 24 hr capsule Take 1 capsule (120 mg total) by mouth daily. 30 capsule 9  . furosemide (LASIX) 20 MG tablet Take 1 tablet (20 mg total) by mouth as needed for fluid. 30 tablet 3  . Ketotifen Fumarate (ALLERGY EYE DROPS OP) Place 1 drop into both eyes daily as needed (allergies).    . magnesium hydroxide (MILK OF MAGNESIA) 400 MG/5ML suspension Take 30 mLs by mouth daily as needed for mild constipation.    . Multiple Vitamin (MULTIVITAMIN WITH MINERALS) TABS tablet Take 1 tablet by mouth daily.    . nitroGLYCERIN (NITROSTAT) 0.4 MG SL tablet Place 1 tablet (0.4 mg total) under the tongue every 5 (five) minutes as needed for chest pain. 25 tablet 3  . Rivaroxaban (XARELTO) 15 MG TABS tablet Take 1 tablet (15 mg total) by mouth daily with supper. 30 tablet 5  . spironolactone (ALDACTONE) 25 MG tablet Take 0.5 tablets (12.5 mg total) by mouth daily. 45 tablet 3   No current facility-administered medications for this visit.    Allergies:   Patient has no known allergies.   Social History: Social History   Socioeconomic History  . Marital status: Married    Spouse name: Not on file  . Number of children: Not on file  . Years of education: Not on file  . Highest education level: Not on file  Occupational History  . Not on file  Tobacco  Use  . Smoking status: Former Smoker    Types: Cigarettes  . Smokeless tobacco: Former Neurosurgeon  . Tobacco comment: pt states she quit smoking 50 years ago  Vaping Use  . Vaping Use: Never used  Substance and Sexual Activity  . Alcohol use: Never  . Drug use: Never  . Sexual activity: Not on file  Other Topics Concern  . Not on file  Social History Narrative  . Not on file   Social Determinants of Health   Financial Resource Strain: Not on file  Food Insecurity: Not on file  Transportation Needs: Not on file  Physical Activity:  Not on file  Stress: Not on file  Social Connections: Not on file  Intimate Partner Violence: Not on file    Family History: Family History  Problem Relation Age of Onset  . Hypertension Mother   . Breast cancer Maternal Aunt      Review of Systems: All other systems reviewed and are otherwise negative except as noted above.  Physical Exam: Vitals:   04/05/20 0935  BP: (!) 144/70  Pulse: 70  SpO2: 98%  Weight: 155 lb (70.3 kg)  Height: 5\' 3"  (1.6 m)     GEN- The patient is well appearing, alert and oriented x 3 today.   HEENT: normocephalic, atraumatic; sclera clear, conjunctiva pink; hearing intact; oropharynx clear; neck supple  Lungs- Clear to ausculation bilaterally, normal work of breathing.  No wheezes, rales, rhonchi Heart- Regular rate and rhythm, no murmurs, rubs or gallops  GI- soft, non-tender, non-distended, bowel sounds present  Extremities- no clubbing or cyanosis. No edema MS- no significant deformity or atrophy Skin- warm and dry, no rash or lesion; PPM pocket well healed Psych- euthymic mood, full affect Neuro- strength and sensation are intact  PPM Interrogation- reviewed in detail today,  See PACEART report  EKG:  EKG is ordered today. The ekg ordered today shows A paced V sensed at 70 bpm  Recent Labs: No results found for requested labs within last 8760 hours.   Wt Readings from Last 3 Encounters:  04/05/20 155 lb (70.3 kg)  04/25/19 154 lb 9.6 oz (70.1 kg)  11/15/18 153 lb 3.2 oz (69.5 kg)     Other studies Reviewed: Additional studies/ records that were reviewed today include: Previous EP office notes, Previous remote checks, Most recent labwork.   Assessment and Plan:  1. SND s/p St. Jude PPM  Normal PPM function See Pace Art report No changes today  2. Atrial fibrillation - Paroxysmal Burden 0.7% by device interrogation. No AMS since December.  Continue Xarelto for CHA2DS2VASC of at least 6.    3. HTN Slightly elevated on  arrival today but usually stable. Follow.   4. Orthostatic lightheadedness  1-2 times a week. Will check BMET today. She uses prn lasix 2-3 times a week. Encouraged salt and fluid restriction, and she may be able to use less lasix, which I suspect would help with this.    Current medicines are reviewed at length with the patient today.   The patient does not have concerns regarding her medicines.  The following changes were made today:  none  Labs/ tests ordered today include:  Orders Placed This Encounter  Procedures  . Basic metabolic panel  . CBC  . TSH  . EKG 12-Lead   Disposition:   Follow up with Dr. January in 6 Months    Signed, Graciela Husbands  04/05/2020 11:48 AM  [facility] [address] [place] [address] [phone] (office) [phone] (fax)

## 2020-04-05 ENCOUNTER — Other Ambulatory Visit: Payer: Self-pay

## 2020-04-05 ENCOUNTER — Ambulatory Visit: Payer: Medicare HMO | Admitting: Student

## 2020-04-05 VITALS — BP 144/70 | HR 70 | Ht 63.0 in | Wt 155.0 lb

## 2020-04-05 DIAGNOSIS — I480 Paroxysmal atrial fibrillation: Secondary | ICD-10-CM

## 2020-04-05 DIAGNOSIS — I495 Sick sinus syndrome: Secondary | ICD-10-CM

## 2020-04-05 DIAGNOSIS — Z950 Presence of cardiac pacemaker: Secondary | ICD-10-CM

## 2020-04-05 DIAGNOSIS — I10 Essential (primary) hypertension: Secondary | ICD-10-CM

## 2020-04-05 LAB — CUP PACEART INCLINIC DEVICE CHECK
Battery Remaining Longevity: 116 mo
Battery Voltage: 2.98 V
Brady Statistic RA Percent Paced: 95 %
Brady Statistic RV Percent Paced: 0.59 %
Date Time Interrogation Session: 20220317094000
Implantable Lead Implant Date: 20180312
Implantable Lead Implant Date: 20180312
Implantable Lead Location: 753859
Implantable Lead Location: 753860
Implantable Pulse Generator Implant Date: 20180312
Lead Channel Impedance Value: 462.5 Ohm
Lead Channel Impedance Value: 650 Ohm
Lead Channel Pacing Threshold Amplitude: 0.5 V
Lead Channel Pacing Threshold Amplitude: 0.625 V
Lead Channel Pacing Threshold Pulse Width: 0.5 ms
Lead Channel Pacing Threshold Pulse Width: 0.5 ms
Lead Channel Sensing Intrinsic Amplitude: 12 mV
Lead Channel Sensing Intrinsic Amplitude: 4.2 mV
Lead Channel Setting Pacing Amplitude: 0.875
Lead Channel Setting Pacing Amplitude: 2 V
Lead Channel Setting Pacing Pulse Width: 0.5 ms
Lead Channel Setting Sensing Sensitivity: 2 mV
Pulse Gen Model: 2272
Pulse Gen Serial Number: 8005635

## 2020-04-05 LAB — CBC
Hematocrit: 39.5 % (ref 34.0–46.6)
Hemoglobin: 13 g/dL (ref 11.1–15.9)
MCH: 29.1 pg (ref 26.6–33.0)
MCHC: 32.9 g/dL (ref 31.5–35.7)
MCV: 88 fL (ref 79–97)
Platelets: 205 10*3/uL (ref 150–450)
RBC: 4.47 x10E6/uL (ref 3.77–5.28)
RDW: 12.8 % (ref 11.7–15.4)
WBC: 5.8 10*3/uL (ref 3.4–10.8)

## 2020-04-05 LAB — BASIC METABOLIC PANEL
BUN/Creatinine Ratio: 11 — ABNORMAL LOW (ref 12–28)
BUN: 12 mg/dL (ref 8–27)
CO2: 23 mmol/L (ref 20–29)
Calcium: 10.6 mg/dL — ABNORMAL HIGH (ref 8.7–10.3)
Chloride: 100 mmol/L (ref 96–106)
Creatinine, Ser: 1.1 mg/dL — ABNORMAL HIGH (ref 0.57–1.00)
Glucose: 117 mg/dL — ABNORMAL HIGH (ref 65–99)
Potassium: 4.2 mmol/L (ref 3.5–5.2)
Sodium: 138 mmol/L (ref 134–144)
eGFR: 50 mL/min/{1.73_m2} — ABNORMAL LOW (ref >59)

## 2020-04-05 LAB — TSH: TSH: 1.86 u[IU]/mL (ref 0.450–4.500)

## 2020-04-05 NOTE — Patient Instructions (Signed)
Medication Instructions:  Your physician recommends that you continue on your current medications as directed. Please refer to the Current Medication list given to you today.  *If you need a refill on your cardiac medications before your next appointment, please call your pharmacy*   Lab Work: TODAY: BMET, CBC, TSH If you have labs (blood work) drawn today and your tests are completely normal, you will receive your results only by: Marland Kitchen MyChart Message (if you have MyChart) OR . A paper copy in the mail If you have any lab test that is abnormal or we need to change your treatment, we will call you to review the results.  Follow-Up: At [facility], you and your health needs are our priority.  As part of our continuing mission to provide you with exceptional heart care, we have created designated Provider Care Teams.  These Care Teams include your primary Cardiologist (physician) and Advanced Practice Providers (APPs -  Physician Assistants and Nurse Practitioners) who all work together to provide you with the care you need, when you need it.  Your next appointment:   6 month(s)  The format for your next appointment:   In Person  Provider:   You may see Sherryl Manges, MD or one of the following Advanced Practice Providers on your designated Care Team:    Gypsy Balsam, NP  Francis Dowse, PA-C  Casimiro Needle "[place]" [place], [place]

## 2020-05-02 ENCOUNTER — Ambulatory Visit (INDEPENDENT_AMBULATORY_CARE_PROVIDER_SITE_OTHER): Payer: Medicare HMO

## 2020-05-02 DIAGNOSIS — I495 Sick sinus syndrome: Secondary | ICD-10-CM

## 2020-05-03 LAB — CUP PACEART REMOTE DEVICE CHECK
Battery Remaining Longevity: 118 mo
Battery Remaining Percentage: 95.5 %
Battery Voltage: 2.98 V
Brady Statistic AP VP Percent: 1 %
Brady Statistic AP VS Percent: 96 %
Brady Statistic AS VP Percent: 1 %
Brady Statistic AS VS Percent: 2.5 %
Brady Statistic RA Percent Paced: 96 %
Brady Statistic RV Percent Paced: 1 %
Date Time Interrogation Session: 20220413040016
Implantable Lead Implant Date: 20180312
Implantable Lead Implant Date: 20180312
Implantable Lead Location: 753859
Implantable Lead Location: 753860
Implantable Pulse Generator Implant Date: 20180312
Lead Channel Impedance Value: 440 Ohm
Lead Channel Impedance Value: 580 Ohm
Lead Channel Pacing Threshold Amplitude: 0.625 V
Lead Channel Pacing Threshold Amplitude: 0.75 V
Lead Channel Pacing Threshold Pulse Width: 0.5 ms
Lead Channel Pacing Threshold Pulse Width: 0.5 ms
Lead Channel Sensing Intrinsic Amplitude: 12 mV
Lead Channel Sensing Intrinsic Amplitude: 5 mV
Lead Channel Setting Pacing Amplitude: 1 V
Lead Channel Setting Pacing Amplitude: 2 V
Lead Channel Setting Pacing Pulse Width: 0.5 ms
Lead Channel Setting Sensing Sensitivity: 2 mV
Pulse Gen Model: 2272
Pulse Gen Serial Number: 8005635

## 2020-05-17 NOTE — Progress Notes (Signed)
Remote pacemaker transmission.   

## 2020-08-01 ENCOUNTER — Telehealth: Payer: Self-pay | Admitting: Internal Medicine

## 2020-08-01 ENCOUNTER — Ambulatory Visit (INDEPENDENT_AMBULATORY_CARE_PROVIDER_SITE_OTHER): Payer: Medicare HMO

## 2020-08-01 DIAGNOSIS — I495 Sick sinus syndrome: Secondary | ICD-10-CM

## 2020-08-01 LAB — CUP PACEART REMOTE DEVICE CHECK
Battery Remaining Longevity: 65 mo
Battery Remaining Percentage: 56 %
Battery Voltage: 2.98 V
Brady Statistic AP VP Percent: 1 %
Brady Statistic AP VS Percent: 96 %
Brady Statistic AS VP Percent: 1 %
Brady Statistic AS VS Percent: 2.3 %
Brady Statistic RA Percent Paced: 95 %
Brady Statistic RV Percent Paced: 1 %
Date Time Interrogation Session: 20220713040013
Implantable Lead Implant Date: 20180312
Implantable Lead Implant Date: 20180312
Implantable Lead Location: 753859
Implantable Lead Location: 753860
Implantable Pulse Generator Implant Date: 20180312
Lead Channel Impedance Value: 450 Ohm
Lead Channel Impedance Value: 600 Ohm
Lead Channel Pacing Threshold Amplitude: 0.5 V
Lead Channel Pacing Threshold Amplitude: 0.75 V
Lead Channel Pacing Threshold Pulse Width: 0.5 ms
Lead Channel Pacing Threshold Pulse Width: 0.5 ms
Lead Channel Sensing Intrinsic Amplitude: 12 mV
Lead Channel Sensing Intrinsic Amplitude: 5 mV
Lead Channel Setting Pacing Amplitude: 1 V
Lead Channel Setting Pacing Amplitude: 2 V
Lead Channel Setting Pacing Pulse Width: 0.5 ms
Lead Channel Setting Sensing Sensitivity: 2 mV
Pulse Gen Model: 2272
Pulse Gen Serial Number: 8005635

## 2020-08-01 NOTE — Telephone Encounter (Signed)
Returning phone call. No answer, LMTCB. 

## 2020-08-01 NOTE — Telephone Encounter (Signed)
Patient states she got a letter from device clinic to call the office today. Please advise.

## 2020-08-01 NOTE — Telephone Encounter (Signed)
Patient returned call and advised her transmission was received and normal. Appreciative of call.

## 2020-08-24 NOTE — Progress Notes (Signed)
Remote pacemaker transmission.   

## 2020-10-31 ENCOUNTER — Ambulatory Visit (INDEPENDENT_AMBULATORY_CARE_PROVIDER_SITE_OTHER): Payer: Medicare HMO

## 2020-10-31 DIAGNOSIS — I495 Sick sinus syndrome: Secondary | ICD-10-CM | POA: Diagnosis not present

## 2020-11-01 LAB — CUP PACEART REMOTE DEVICE CHECK
Battery Remaining Longevity: 61 mo
Battery Remaining Percentage: 54 %
Battery Voltage: 2.98 V
Brady Statistic AP VP Percent: 1 %
Brady Statistic AP VS Percent: 96 %
Brady Statistic AS VP Percent: 1 %
Brady Statistic AS VS Percent: 2.9 %
Brady Statistic RA Percent Paced: 95 %
Brady Statistic RV Percent Paced: 1 %
Date Time Interrogation Session: 20221012040019
Implantable Lead Implant Date: 20180312
Implantable Lead Implant Date: 20180312
Implantable Lead Location: 753859
Implantable Lead Location: 753860
Implantable Pulse Generator Implant Date: 20180312
Lead Channel Impedance Value: 460 Ohm
Lead Channel Impedance Value: 580 Ohm
Lead Channel Pacing Threshold Amplitude: 0.75 V
Lead Channel Pacing Threshold Amplitude: 0.75 V
Lead Channel Pacing Threshold Pulse Width: 0.5 ms
Lead Channel Pacing Threshold Pulse Width: 0.5 ms
Lead Channel Sensing Intrinsic Amplitude: 12 mV
Lead Channel Sensing Intrinsic Amplitude: 5 mV
Lead Channel Setting Pacing Amplitude: 1 V
Lead Channel Setting Pacing Amplitude: 2 V
Lead Channel Setting Pacing Pulse Width: 0.5 ms
Lead Channel Setting Sensing Sensitivity: 2 mV
Pulse Gen Model: 2272
Pulse Gen Serial Number: 8005635

## 2020-11-08 NOTE — Progress Notes (Signed)
Remote pacemaker transmission.   

## 2021-01-30 ENCOUNTER — Ambulatory Visit (INDEPENDENT_AMBULATORY_CARE_PROVIDER_SITE_OTHER): Payer: Medicare HMO

## 2021-01-30 DIAGNOSIS — I495 Sick sinus syndrome: Secondary | ICD-10-CM

## 2021-01-30 LAB — CUP PACEART REMOTE DEVICE CHECK
Battery Remaining Longevity: 59 mo
Battery Remaining Percentage: 51 %
Battery Voltage: 2.98 V
Brady Statistic AP VP Percent: 1 %
Brady Statistic AP VS Percent: 95 %
Brady Statistic AS VP Percent: 1 %
Brady Statistic AS VS Percent: 3.4 %
Brady Statistic RA Percent Paced: 95 %
Brady Statistic RV Percent Paced: 1 %
Date Time Interrogation Session: 20230111040012
Implantable Lead Implant Date: 20180312
Implantable Lead Implant Date: 20180312
Implantable Lead Location: 753859
Implantable Lead Location: 753860
Implantable Pulse Generator Implant Date: 20180312
Lead Channel Impedance Value: 460 Ohm
Lead Channel Impedance Value: 600 Ohm
Lead Channel Pacing Threshold Amplitude: 0.5 V
Lead Channel Pacing Threshold Amplitude: 0.75 V
Lead Channel Pacing Threshold Pulse Width: 0.5 ms
Lead Channel Pacing Threshold Pulse Width: 0.5 ms
Lead Channel Sensing Intrinsic Amplitude: 12 mV
Lead Channel Sensing Intrinsic Amplitude: 5 mV
Lead Channel Setting Pacing Amplitude: 1 V
Lead Channel Setting Pacing Amplitude: 2 V
Lead Channel Setting Pacing Pulse Width: 0.5 ms
Lead Channel Setting Sensing Sensitivity: 2 mV
Pulse Gen Model: 2272
Pulse Gen Serial Number: 8005635

## 2021-02-08 NOTE — Progress Notes (Signed)
Remote pacemaker transmission.   

## 2021-05-01 ENCOUNTER — Ambulatory Visit (INDEPENDENT_AMBULATORY_CARE_PROVIDER_SITE_OTHER): Payer: Medicare HMO

## 2021-05-01 DIAGNOSIS — I495 Sick sinus syndrome: Secondary | ICD-10-CM | POA: Diagnosis not present

## 2021-05-01 LAB — CUP PACEART REMOTE DEVICE CHECK
Battery Remaining Longevity: 55 mo
Battery Remaining Percentage: 49 %
Battery Voltage: 2.98 V
Brady Statistic AP VP Percent: 1 %
Brady Statistic AP VS Percent: 96 %
Brady Statistic AS VP Percent: 1 %
Brady Statistic AS VS Percent: 3.4 %
Brady Statistic RA Percent Paced: 96 %
Brady Statistic RV Percent Paced: 1 %
Date Time Interrogation Session: 20230412040731
Implantable Lead Implant Date: 20180312
Implantable Lead Implant Date: 20180312
Implantable Lead Location: 753859
Implantable Lead Location: 753860
Implantable Pulse Generator Implant Date: 20180312
Lead Channel Impedance Value: 460 Ohm
Lead Channel Impedance Value: 630 Ohm
Lead Channel Pacing Threshold Amplitude: 0.75 V
Lead Channel Pacing Threshold Amplitude: 0.75 V
Lead Channel Pacing Threshold Pulse Width: 0.5 ms
Lead Channel Pacing Threshold Pulse Width: 0.5 ms
Lead Channel Sensing Intrinsic Amplitude: 12 mV
Lead Channel Sensing Intrinsic Amplitude: 5 mV
Lead Channel Setting Pacing Amplitude: 1 V
Lead Channel Setting Pacing Amplitude: 2 V
Lead Channel Setting Pacing Pulse Width: 0.5 ms
Lead Channel Setting Sensing Sensitivity: 2 mV
Pulse Gen Model: 2272
Pulse Gen Serial Number: 8005635

## 2021-05-17 NOTE — Progress Notes (Signed)
Remote pacemaker transmission.   

## 2021-07-31 ENCOUNTER — Ambulatory Visit (INDEPENDENT_AMBULATORY_CARE_PROVIDER_SITE_OTHER): Payer: Medicare HMO

## 2021-07-31 DIAGNOSIS — I495 Sick sinus syndrome: Secondary | ICD-10-CM

## 2021-07-31 LAB — CUP PACEART REMOTE DEVICE CHECK
Battery Remaining Longevity: 53 mo
Battery Remaining Percentage: 46 %
Battery Voltage: 2.96 V
Brady Statistic AP VP Percent: 1 %
Brady Statistic AP VS Percent: 96 %
Brady Statistic AS VP Percent: 1 %
Brady Statistic AS VS Percent: 3.4 %
Brady Statistic RA Percent Paced: 96 %
Brady Statistic RV Percent Paced: 1 %
Date Time Interrogation Session: 20230712040014
Implantable Lead Implant Date: 20180312
Implantable Lead Implant Date: 20180312
Implantable Lead Location: 753859
Implantable Lead Location: 753860
Implantable Pulse Generator Implant Date: 20180312
Lead Channel Impedance Value: 460 Ohm
Lead Channel Impedance Value: 630 Ohm
Lead Channel Pacing Threshold Amplitude: 0.5 V
Lead Channel Pacing Threshold Amplitude: 0.75 V
Lead Channel Pacing Threshold Pulse Width: 0.5 ms
Lead Channel Pacing Threshold Pulse Width: 0.5 ms
Lead Channel Sensing Intrinsic Amplitude: 12 mV
Lead Channel Sensing Intrinsic Amplitude: 5 mV
Lead Channel Setting Pacing Amplitude: 1 V
Lead Channel Setting Pacing Amplitude: 2 V
Lead Channel Setting Pacing Pulse Width: 0.5 ms
Lead Channel Setting Sensing Sensitivity: 2 mV
Pulse Gen Model: 2272
Pulse Gen Serial Number: 8005635

## 2021-08-19 NOTE — Progress Notes (Signed)
Remote pacemaker transmission.   

## 2021-10-30 ENCOUNTER — Ambulatory Visit (INDEPENDENT_AMBULATORY_CARE_PROVIDER_SITE_OTHER): Payer: Medicare HMO

## 2021-10-30 DIAGNOSIS — I495 Sick sinus syndrome: Secondary | ICD-10-CM | POA: Diagnosis not present

## 2021-10-30 LAB — CUP PACEART REMOTE DEVICE CHECK
Battery Remaining Longevity: 49 mo
Battery Remaining Percentage: 43 %
Battery Voltage: 2.96 V
Brady Statistic AP VP Percent: 1 %
Brady Statistic AP VS Percent: 96 %
Brady Statistic AS VP Percent: 1 %
Brady Statistic AS VS Percent: 3.4 %
Brady Statistic RA Percent Paced: 96 %
Brady Statistic RV Percent Paced: 1 %
Date Time Interrogation Session: 20231011052934
Implantable Lead Implant Date: 20180312
Implantable Lead Implant Date: 20180312
Implantable Lead Location: 753859
Implantable Lead Location: 753860
Implantable Pulse Generator Implant Date: 20180312
Lead Channel Impedance Value: 460 Ohm
Lead Channel Impedance Value: 600 Ohm
Lead Channel Pacing Threshold Amplitude: 0.5 V
Lead Channel Pacing Threshold Amplitude: 0.75 V
Lead Channel Pacing Threshold Pulse Width: 0.5 ms
Lead Channel Pacing Threshold Pulse Width: 0.5 ms
Lead Channel Sensing Intrinsic Amplitude: 12 mV
Lead Channel Sensing Intrinsic Amplitude: 5 mV
Lead Channel Setting Pacing Amplitude: 1 V
Lead Channel Setting Pacing Amplitude: 2 V
Lead Channel Setting Pacing Pulse Width: 0.5 ms
Lead Channel Setting Sensing Sensitivity: 2 mV
Pulse Gen Model: 2272
Pulse Gen Serial Number: 8005635

## 2021-11-12 NOTE — Progress Notes (Signed)
Remote pacemaker transmission.   

## 2022-01-29 ENCOUNTER — Ambulatory Visit (INDEPENDENT_AMBULATORY_CARE_PROVIDER_SITE_OTHER): Payer: Medicare HMO

## 2022-01-29 DIAGNOSIS — I495 Sick sinus syndrome: Secondary | ICD-10-CM | POA: Diagnosis not present

## 2022-01-29 LAB — CUP PACEART REMOTE DEVICE CHECK
Battery Remaining Longevity: 47 mo
Battery Remaining Percentage: 41 %
Battery Voltage: 2.96 V
Brady Statistic AP VP Percent: 1 %
Brady Statistic AP VS Percent: 96 %
Brady Statistic AS VP Percent: 1 %
Brady Statistic AS VS Percent: 3.3 %
Brady Statistic RA Percent Paced: 96 %
Brady Statistic RV Percent Paced: 1 %
Date Time Interrogation Session: 20240110040014
Implantable Lead Connection Status: 753985
Implantable Lead Connection Status: 753985
Implantable Lead Implant Date: 20180312
Implantable Lead Implant Date: 20180312
Implantable Lead Location: 753859
Implantable Lead Location: 753860
Implantable Pulse Generator Implant Date: 20180312
Lead Channel Impedance Value: 460 Ohm
Lead Channel Impedance Value: 580 Ohm
Lead Channel Pacing Threshold Amplitude: 0.625 V
Lead Channel Pacing Threshold Amplitude: 0.75 V
Lead Channel Pacing Threshold Pulse Width: 0.5 ms
Lead Channel Pacing Threshold Pulse Width: 0.5 ms
Lead Channel Sensing Intrinsic Amplitude: 12 mV
Lead Channel Sensing Intrinsic Amplitude: 5 mV
Lead Channel Setting Pacing Amplitude: 1 V
Lead Channel Setting Pacing Amplitude: 2 V
Lead Channel Setting Pacing Pulse Width: 0.5 ms
Lead Channel Setting Sensing Sensitivity: 2 mV
Pulse Gen Model: 2272
Pulse Gen Serial Number: 8005635

## 2022-02-19 NOTE — Progress Notes (Signed)
Remote pacemaker transmission.   

## 2022-04-30 ENCOUNTER — Ambulatory Visit (INDEPENDENT_AMBULATORY_CARE_PROVIDER_SITE_OTHER): Payer: Medicare HMO

## 2022-04-30 DIAGNOSIS — I495 Sick sinus syndrome: Secondary | ICD-10-CM | POA: Diagnosis not present

## 2022-04-30 LAB — CUP PACEART REMOTE DEVICE CHECK
Battery Remaining Longevity: 43 mo
Battery Remaining Percentage: 38 %
Battery Voltage: 2.96 V
Brady Statistic AP VP Percent: 1 %
Brady Statistic AP VS Percent: 96 %
Brady Statistic AS VP Percent: 1 %
Brady Statistic AS VS Percent: 3.2 %
Brady Statistic RA Percent Paced: 96 %
Brady Statistic RV Percent Paced: 1 %
Date Time Interrogation Session: 20240410045123
Implantable Lead Connection Status: 753985
Implantable Lead Connection Status: 753985
Implantable Lead Implant Date: 20180312
Implantable Lead Implant Date: 20180312
Implantable Lead Location: 753859
Implantable Lead Location: 753860
Implantable Pulse Generator Implant Date: 20180312
Lead Channel Impedance Value: 460 Ohm
Lead Channel Impedance Value: 610 Ohm
Lead Channel Pacing Threshold Amplitude: 0.75 V
Lead Channel Pacing Threshold Amplitude: 0.75 V
Lead Channel Pacing Threshold Pulse Width: 0.5 ms
Lead Channel Pacing Threshold Pulse Width: 0.5 ms
Lead Channel Sensing Intrinsic Amplitude: 12 mV
Lead Channel Sensing Intrinsic Amplitude: 5 mV
Lead Channel Setting Pacing Amplitude: 1 V
Lead Channel Setting Pacing Amplitude: 2 V
Lead Channel Setting Pacing Pulse Width: 0.5 ms
Lead Channel Setting Sensing Sensitivity: 2 mV
Pulse Gen Model: 2272
Pulse Gen Serial Number: 8005635

## 2022-05-08 ENCOUNTER — Ambulatory Visit: Payer: Medicare HMO | Admitting: Internal Medicine

## 2022-05-27 ENCOUNTER — Encounter: Payer: Self-pay | Admitting: Internal Medicine

## 2022-05-27 ENCOUNTER — Ambulatory Visit: Payer: Medicare HMO | Attending: Internal Medicine | Admitting: Internal Medicine

## 2022-05-27 VITALS — BP 124/72 | HR 78 | Ht 63.0 in | Wt 150.0 lb

## 2022-05-27 DIAGNOSIS — R072 Precordial pain: Secondary | ICD-10-CM

## 2022-05-27 DIAGNOSIS — I480 Paroxysmal atrial fibrillation: Secondary | ICD-10-CM | POA: Diagnosis not present

## 2022-05-27 DIAGNOSIS — I10 Essential (primary) hypertension: Secondary | ICD-10-CM

## 2022-05-27 DIAGNOSIS — I495 Sick sinus syndrome: Secondary | ICD-10-CM

## 2022-05-27 DIAGNOSIS — Z950 Presence of cardiac pacemaker: Secondary | ICD-10-CM | POA: Diagnosis not present

## 2022-05-27 DIAGNOSIS — R42 Dizziness and giddiness: Secondary | ICD-10-CM

## 2022-05-27 DIAGNOSIS — E785 Hyperlipidemia, unspecified: Secondary | ICD-10-CM

## 2022-05-27 DIAGNOSIS — Z79899 Other long term (current) drug therapy: Secondary | ICD-10-CM

## 2022-05-27 LAB — CUP PACEART INCLINIC DEVICE CHECK
Battery Remaining Longevity: 43 mo
Battery Voltage: 2.96 V
Brady Statistic RA Percent Paced: 96 %
Brady Statistic RV Percent Paced: 0.64 %
Date Time Interrogation Session: 20240507162815
Implantable Lead Connection Status: 753985
Implantable Lead Connection Status: 753985
Implantable Lead Implant Date: 20180312
Implantable Lead Implant Date: 20180312
Implantable Lead Location: 753859
Implantable Lead Location: 753860
Implantable Pulse Generator Implant Date: 20180312
Lead Channel Impedance Value: 512.5 Ohm
Lead Channel Impedance Value: 650 Ohm
Lead Channel Pacing Threshold Amplitude: 0.5 V
Lead Channel Pacing Threshold Amplitude: 0.5 V
Lead Channel Pacing Threshold Amplitude: 0.75 V
Lead Channel Pacing Threshold Amplitude: 0.75 V
Lead Channel Pacing Threshold Pulse Width: 0.5 ms
Lead Channel Pacing Threshold Pulse Width: 0.5 ms
Lead Channel Pacing Threshold Pulse Width: 0.5 ms
Lead Channel Pacing Threshold Pulse Width: 0.5 ms
Lead Channel Sensing Intrinsic Amplitude: 12 mV
Lead Channel Sensing Intrinsic Amplitude: 5 mV
Lead Channel Setting Pacing Amplitude: 0.875
Lead Channel Setting Pacing Amplitude: 2 V
Lead Channel Setting Pacing Pulse Width: 0.5 ms
Lead Channel Setting Sensing Sensitivity: 2 mV
Pulse Gen Model: 2272
Pulse Gen Serial Number: 8005635

## 2022-05-27 MED ORDER — ISOSORBIDE MONONITRATE ER 30 MG PO TB24: 30.0000 mg | ORAL_TABLET | Freq: Every day | ORAL | 3 refills | Status: DC

## 2022-05-27 MED ORDER — NITROGLYCERIN 0.4 MG SL SUBL: 0.4000 mg | SUBLINGUAL_TABLET | SUBLINGUAL | 3 refills | Status: DC | PRN

## 2022-05-27 MED ORDER — METOPROLOL TARTRATE 100 MG PO TABS: ORAL_TABLET | ORAL | 0 refills | Status: AC

## 2022-05-27 NOTE — Patient Instructions (Addendum)
Medication Instructions:  Your physician has recommended you make the following change in your medication:   Begin Isosorbide mononitrate 30mg  - 1 tablet by mouth daily   *If you need a refill on your cardiac medications before your next appointment, please call your pharmacy*   Lab Work: CBC, BMET, Lipid Panel, Direct LDL  If you have labs (blood work) drawn today and your tests are completely normal, you will receive your results only by: MyChart Message (if you have MyChart) OR A paper copy in the mail If you have any lab test that is abnormal or we need to change your treatment, we will call you to review the results.   Testing/Procedures: Your physician has requested that you have cardiac CT. Cardiac computed tomography (CT) is a painless test that uses an x-ray machine to take clear, detailed pictures of your heart. For further information please visit [email or web address] Please follow instruction sheet as given.     Follow-Up: At [facility], you and your health needs are our priority.  As part of our continuing mission to provide you with exceptional heart care, we have created designated Provider Care Teams.  These Care Teams include your primary Cardiologist (physician) and Advanced Practice Providers (APPs -  Physician Assistants and Nurse Practitioners) who all work together to provide you with the care you need, when you need it.  We recommend signing up for the patient portal called "MyChart".  Sign up information is provided on this After Visit Summary.  MyChart is used to connect with patients for Virtual Visits (Telemedicine).  Patients are able to view lab/test results, encounter notes, upcoming appointments, etc.  Non-urgent messages can be sent to your provider as well.   To learn more about what you can do with MyChart, go to [email or web address].    Your next appointment:   12 months with Dr Graciela Husbands    Your cardiac CT will be scheduled at one of  the below locations:   [facility] [address] [place], [place] [address] [phone]  If scheduled at [facility], please arrive at the [facility] Entrance (Entrance C2) of [facility] 30 minutes prior to test start time. You can use the FREE valet parking offered at entrance C (encouraged to control the heart rate for the test)  Proceed to the [facility] Radiology Department (first floor) to check-in and test prep.  All radiology patients and guests should use entrance C2 at [facility], accessed from [facility], even though the hospital's physical address listed is [address].    If scheduled at [facility] or [facility], please arrive 15 mins early for check-in and test prep.   Please follow these instructions carefully (unless otherwise directed):  Hold all erectile dysfunction medications at least 3 days (72 hrs) prior to test. (Ie viagra, cialis, sildenafil, tadalafil, etc) We will administer nitroglycerin during this exam.   On the Night Before the Test: Be sure to Drink plenty of water. Do not consume any caffeinated/decaffeinated beverages or chocolate 12 hours prior to your test. Do not take any antihistamines 12 hours prior to your test.  On the Day of the Test: Drink plenty of water until 1 hour prior to the test. Do not eat any food 1 hour prior to test. You may take your regular medications prior to the test.  Take metoprolol (Lopressor) two hours prior to test. If you take Furosemide/Hydrochlorothiazide/Spironolactone, please  HOLD on the morning of the test. FEMALES- please wear underwire-free bra if available, avoid dresses & tight clothing       After the Test: Drink plenty of water. After receiving IV contrast, you may experience a mild flushed feeling. This is normal. On occasion, you may experience a mild rash up to 24 hours after  the test. This is not dangerous. If this occurs, you can take Benadryl 25 mg and increase your fluid intake. If you experience trouble breathing, this can be serious. If it is severe call 911 IMMEDIATELY. If it is mild, please call our office. If you take any of these medications: Glipizide/Metformin, Avandament, Glucavance, please do not take 48 hours after completing test unless otherwise instructed.  We will call to schedule your test 2-4 weeks out understanding that some insurance companies will need an authorization prior to the service being performed.   For non-scheduling related questions, please contact the cardiac imaging nurse navigator should you have any questions/concerns: Rockwell Alexandria, Cardiac Imaging Nurse Navigator Larey Brick, Cardiac Imaging Nurse Navigator [facility] Direct Office Dial: [phone]   For scheduling needs, including cancellations and rescheduling, please call [place], [phone].

## 2022-05-27 NOTE — Progress Notes (Signed)
Patient Care Team: Karle Plumber, MD as PCP - General (Internal Medicine) Duke Salvia, MD as PCP - Electrophysiology (Cardiology)   HPI  Janet Everett is a 87 y.o. female [name] in followup for Afib and post termination pauses and sinus node dysfunction for which she underwent pacing 3/`18 Anticoagulated with her Xarelto   HX of CAD with prior stenting x 2 done remotely, specific data not available  Hypertension  Date Cr Hgb LDL   3/18 0.98 13.6 162  16/10 0.79 13.2   10/20 1.05 13.7   3/22 1.10 13.0     The patient denies nocturnal dyspnea, orthopnea.  There have been no palpitations, lightheadednESS or syncope .  Complains of dyspnea on exertion over the last 6 months frequently but not always accompanied by chest discomfort described as a pressure radiating to her left arm.  Resolves with rest after about 3-5 minutes..         Past Medical History:  Diagnosis Date   Atrial fibrillation and flutter (HCC)    Essential hypertension 04/01/2016   Lightheadedness/presyncope 04/01/2016    Past Surgical History:  Procedure Laterality Date   BREAST CYST ASPIRATION Right age 89   BREAST CYST EXCISION Right    age 64 and 2010   BREAST EXCISIONAL BIOPSY Right 2011   PACEMAKER IMPLANT N/A 03/31/2016   Procedure: Pacemaker Implant;  Surgeon: Duke Salvia, MD;  Location: [facility] INVASIVE CV LAB;  Service: Cardiovascular;  Laterality: N/A;   POCKET REVISION/RELOCATION N/A 04/02/2018   Procedure: POCKET REVISION/RELOCATION;  Surgeon: Duke Salvia, MD;  Location: [facility] INVASIVE CV LAB;  Service: Cardiovascular;  Laterality: N/A;    Current Outpatient Medications  Medication Sig Dispense Refill   acetaminophen (TYLENOL) 500 MG tablet Take 1,000 mg by mouth daily as needed for moderate pain or headache.     ALPRAZolam (XANAX) 0.5 MG tablet Take 0.5 mg by mouth daily as needed for anxiety.      Aromatic Inhalants (VICKS VAPOINHALER IN) Inhale 1 puff into the lungs daily as  needed (congestion).     atorvastatin (LIPITOR) 40 MG tablet TAKE 1 TABLET(40 MG) BY MOUTH DAILY 90 tablet 3   cholecalciferol (VITAMIN D3) 25 MCG (1000 UT) tablet Take 1,000 Units by mouth 4 (four) times a week.     diltiazem (CARDIZEM CD) 240 MG 24 hr capsule Take 240 mg by mouth daily.     furosemide (LASIX) 20 MG tablet Take 1 tablet (20 mg total) by mouth as needed for fluid. 30 tablet 3   Ketotifen Fumarate (ALLERGY EYE DROPS OP) Place 1 drop into both eyes daily as needed (allergies).     magnesium hydroxide (MILK OF MAGNESIA) 400 MG/5ML suspension Take 30 mLs by mouth daily as needed for mild constipation.     Multiple Vitamin (MULTIVITAMIN WITH MINERALS) TABS tablet Take 1 tablet by mouth daily.     nitroGLYCERIN (NITROSTAT) 0.4 MG SL tablet Place 1 tablet (0.4 mg total) under the tongue every 5 (five) minutes as needed for chest pain. 25 tablet 3   Rivaroxaban (XARELTO) 15 MG TABS tablet Take 1 tablet (15 mg total) by mouth daily with supper. 30 tablet 5   spironolactone (ALDACTONE) 25 MG tablet Take 25 mg by mouth daily.     No current facility-administered medications for this visit.    No Known Allergies    Review of Systems negative except from HPI and PMH  Physical Exam BP 124/72 (Patient Position: Standing)  Pulse 78   Ht 5\' 3"  (1.6 m)   Wt 150 lb (68 kg)   SpO2 98%   BMI 26.57 kg/m  Well developed and well nourished in no acute distress HENT normal Neck supple   Clear Device pocket well healed; without hematoma or erythema.  There is no tethering  Regular rate and rhythm, no  gallop No  murmur Abd-soft with active BS No Clubbing cyanosis  edema Skin-warm and dry A & Oriented  Grossly normal sensory and motor function  ECG atrial paced at 72 Intervals 14/08/42 T wave inversions V2-V6 Not appreciably different from 3/22  Device function is normal. Programming changes max sensor rate from 120-110 see Paceart for details     Assessment and  Plan Atrial  fibrillation-paroxysmal  Sinus node dysfunction  Pacemaker St Jude  Hypertension  Exertional chest discomfort and shortness of breath  Orthostatic lightheadedness   Hyperlipidemia   Blood pressure is well-controlled.  Orthostatic lightheadedness is not currently an issue.  Her symptoms are strongly suggestive of angina.  Her LDL is very elevated  Will undertake CTA to assess coronary arteries.  In the interim, we will have her hold her spironolactone begin aspirin 81 in conjunction with her Xarelto and begin her on isosorbide mononitrate 30 mg every morning  She is advaiosed if her symptoms recur and presist to go urgently to ER  Will also give Rx for SL NTG       Current medicines are reviewed at length with the patient today .  The patient does not have concerns regarding medicines.

## 2022-05-28 LAB — BASIC METABOLIC PANEL
BUN/Creatinine Ratio: 17 (ref 12–28)
BUN: 20 mg/dL (ref 8–27)
CO2: 20 mmol/L (ref 20–29)
Calcium: 11.2 mg/dL — ABNORMAL HIGH (ref 8.7–10.3)
Chloride: 104 mmol/L (ref 96–106)
Creatinine, Ser: 1.17 mg/dL — ABNORMAL HIGH (ref 0.57–1.00)
Glucose: 100 mg/dL — ABNORMAL HIGH (ref 70–99)
Potassium: 5 mmol/L (ref 3.5–5.2)
Sodium: 139 mmol/L (ref 134–144)
eGFR: 46 mL/min/{1.73_m2} — ABNORMAL LOW (ref >59)

## 2022-05-28 LAB — CBC
Hematocrit: 42.8 % (ref 34.0–46.6)
Hemoglobin: 13.8 g/dL (ref 11.1–15.9)
MCH: 28 pg (ref 26.6–33.0)
MCHC: 32.2 g/dL (ref 31.5–35.7)
MCV: 87 fL (ref 79–97)
Platelets: 218 10*3/uL (ref 150–450)
RBC: 4.92 x10E6/uL (ref 3.77–5.28)
RDW: 12.5 % (ref 11.7–15.4)
WBC: 7.1 10*3/uL (ref 3.4–10.8)

## 2022-05-28 LAB — LIPID PANEL
Chol/HDL Ratio: 2.7 ratio (ref 0.0–4.4)
Cholesterol, Total: 162 mg/dL (ref 100–199)
HDL: 61 mg/dL (ref >39)
LDL Chol Calc (NIH): 86 mg/dL (ref 0–99)
Triglycerides: 82 mg/dL (ref 0–149)
VLDL Cholesterol Cal: 15 mg/dL (ref 5–40)

## 2022-05-28 LAB — LDL CHOLESTEROL, DIRECT: LDL Direct: 92 mg/dL (ref 0–99)

## 2022-06-03 ENCOUNTER — Telehealth (HOSPITAL_COMMUNITY): Payer: Self-pay | Admitting: *Deleted

## 2022-06-03 NOTE — Telephone Encounter (Signed)
Reaching out to patient to offer assistance regarding upcoming cardiac imaging study; pt verbalizes understanding of appt date/time, parking situation and where to check in, pre-test NPO status and medications ordered, and verified current allergies; name and call back number provided for further questions should they arise  Jaqlyn Gruenhagen RN Navigator Cardiac Imaging [facility] [phone] office [phone] cell  Patient to take 100mg metoprolol tartrate two hours prior to her cardiac CT scan. She is aware to arrive at 12pm. 

## 2022-06-04 ENCOUNTER — Ambulatory Visit (HOSPITAL_COMMUNITY)
Admission: RE | Admit: 2022-06-04 | Discharge: 2022-06-04 | Disposition: A | Discharge status: Home / Self Care | Payer: Medicare HMO | Source: Ambulatory Visit | Attending: Internal Medicine | Admitting: Internal Medicine

## 2022-06-04 DIAGNOSIS — R072 Precordial pain: Secondary | ICD-10-CM | POA: Insufficient documentation

## 2022-06-04 MED ORDER — NITROGLYCERIN 0.4 MG SL SUBL
SUBLINGUAL_TABLET | SUBLINGUAL | Status: AC
  Filled 2022-06-04: qty 2

## 2022-06-04 MED ORDER — NITROGLYCERIN 0.4 MG SL SUBL
0.8000 mg | SUBLINGUAL_TABLET | Freq: Once | SUBLINGUAL | Status: AC
  Administered 2022-06-04: 0.8 mg via SUBLINGUAL

## 2022-06-04 MED ORDER — IOHEXOL 350 MG/ML SOLN
95.0000 mL | Freq: Once | INTRAVENOUS | Status: AC | PRN
  Administered 2022-06-04: 95 mL via INTRAVENOUS

## 2022-06-06 NOTE — Progress Notes (Signed)
Remote pacemaker transmission.   

## 2022-06-20 ENCOUNTER — Telehealth: Payer: Self-pay

## 2022-06-20 DIAGNOSIS — I422 Other hypertrophic cardiomyopathy: Secondary | ICD-10-CM

## 2022-06-20 NOTE — Telephone Encounter (Signed)
-----   Message from Duke Salvia, MD sent at 06/15/2022  3:19 PM EDT ----- Please Inform Patient that study showed no interval or significant stenosis It did raise the possiblity of apical HCM, the importance of which is a diagnosis for the family Recommended TTE with contrast -- please note on requisition"apical HCM"  Thanks

## 2022-06-20 NOTE — Telephone Encounter (Signed)
Spoke with pt and advised or CT results per Dr Graciela Husbands as below.  Pt is agreeable to scheduling echo and thanked RN for the call.  Please Inform Patient that study showed no interval or significant stenosis It did raise the possiblity of apical HCM, the importance of which is a diagnosis for the family Recommended TTE with contrast -- please note on requisition"apical HCM"  Thanks Duke Salvia, MD

## 2022-06-25 ENCOUNTER — Ambulatory Visit (HOSPITAL_COMMUNITY): Payer: Medicare HMO | Attending: Internal Medicine

## 2022-06-25 DIAGNOSIS — I422 Other hypertrophic cardiomyopathy: Secondary | ICD-10-CM | POA: Diagnosis present

## 2022-06-25 LAB — ECHOCARDIOGRAM COMPLETE
Area-P 1/2: 4.68 cm2
S' Lateral: 1.9 cm

## 2022-06-25 MED ORDER — PERFLUTREN LIPID MICROSPHERE
1.0000 mL | INTRAVENOUS | Status: AC | PRN
Start: 2022-06-25 — End: 2022-06-25
  Administered 2022-06-25: 2 mL via INTRAVENOUS

## 2022-07-30 ENCOUNTER — Ambulatory Visit (INDEPENDENT_AMBULATORY_CARE_PROVIDER_SITE_OTHER): Payer: Medicare HMO

## 2022-07-30 DIAGNOSIS — I495 Sick sinus syndrome: Secondary | ICD-10-CM | POA: Diagnosis not present

## 2022-07-30 LAB — CUP PACEART REMOTE DEVICE CHECK
Battery Remaining Longevity: 41 mo
Battery Remaining Percentage: 35 %
Battery Voltage: 2.95 V
Brady Statistic AP VP Percent: 1 %
Brady Statistic AP VS Percent: 96 %
Brady Statistic AS VP Percent: 1 %
Brady Statistic AS VS Percent: 3.5 %
Brady Statistic RA Percent Paced: 96 %
Brady Statistic RV Percent Paced: 1 %
Date Time Interrogation Session: 20240710040013
Implantable Lead Connection Status: 753985
Implantable Lead Connection Status: 753985
Implantable Lead Implant Date: 20180312
Implantable Lead Implant Date: 20180312
Implantable Lead Location: 753859
Implantable Lead Location: 753860
Implantable Pulse Generator Implant Date: 20180312
Lead Channel Impedance Value: 490 Ohm
Lead Channel Impedance Value: 650 Ohm
Lead Channel Pacing Threshold Amplitude: 0.75 V
Lead Channel Pacing Threshold Amplitude: 0.75 V
Lead Channel Pacing Threshold Pulse Width: 0.5 ms
Lead Channel Pacing Threshold Pulse Width: 0.5 ms
Lead Channel Sensing Intrinsic Amplitude: 12 mV
Lead Channel Sensing Intrinsic Amplitude: 5 mV
Lead Channel Setting Pacing Amplitude: 1 V
Lead Channel Setting Pacing Amplitude: 2 V
Lead Channel Setting Pacing Pulse Width: 0.5 ms
Lead Channel Setting Sensing Sensitivity: 2 mV
Pulse Gen Model: 2272
Pulse Gen Serial Number: 8005635

## 2022-08-07 NOTE — Telephone Encounter (Signed)
Patient is calling back stating she never received CT results and would like to discuss this. Please advise.

## 2022-08-08 NOTE — Telephone Encounter (Signed)
Attempted phone call to pt and left voicemail to call [phone].

## 2022-08-11 NOTE — Telephone Encounter (Signed)
Spoke with pt and reviewed results per Dr Graciela Husbands as below.  Appointment scheduled with Dr Graciela Husbands for 09/24/2022 at 2pm.  Pt verbalizes understanding and thanked RN for the call.     Please Inform Patient Echo showed  normal heart muscle function __echo suggestive for apical HCM  the ECG consistent  will need to discuss fanmily hx at next visti   Thanks Duke Salvia, MD

## 2022-08-20 NOTE — Progress Notes (Signed)
Remote pacemaker transmission.   

## 2022-09-24 ENCOUNTER — Encounter: Payer: Self-pay | Admitting: Internal Medicine

## 2022-09-24 ENCOUNTER — Ambulatory Visit: Payer: Medicare HMO | Attending: Internal Medicine | Admitting: Internal Medicine

## 2022-09-24 VITALS — BP 110/66 | HR 66 | Ht 63.0 in | Wt 150.0 lb

## 2022-09-24 DIAGNOSIS — I480 Paroxysmal atrial fibrillation: Secondary | ICD-10-CM | POA: Diagnosis not present

## 2022-09-24 DIAGNOSIS — R600 Localized edema: Secondary | ICD-10-CM

## 2022-09-24 DIAGNOSIS — I495 Sick sinus syndrome: Secondary | ICD-10-CM

## 2022-09-24 DIAGNOSIS — Z950 Presence of cardiac pacemaker: Secondary | ICD-10-CM | POA: Diagnosis not present

## 2022-09-24 DIAGNOSIS — I422 Other hypertrophic cardiomyopathy: Secondary | ICD-10-CM

## 2022-09-24 NOTE — Patient Instructions (Signed)
Medication Instructions:  Your physician recommends that you continue on your current medications as directed. Please refer to the Current Medication list given to you today.  *If you need a refill on your cardiac medications before your next appointment, please call your pharmacy*   Lab Work: None ordered.  If you have labs (blood work) drawn today and your tests are completely normal, you will receive your results only by: MyChart Message (if you have MyChart) OR A paper copy in the mail If you have any lab test that is abnormal or we need to change your treatment, we will call you to review the results.   Testing/Procedures: Your physician has requested that you have a lower or upper extremity venous duplex. This test is an ultrasound of the veins in the legs or arms. It looks at venous blood flow that carries blood from the heart to the legs or arms. Allow one hour for a Lower Venous exam. Allow thirty minutes for an Upper Venous exam. There are no restrictions or special instructions.   Referral to Genetics - Dr Jomarie Longs   Follow-Up: At [facility], you and your health needs are our priority.  As part of our continuing mission to provide you with exceptional heart care, we have created designated Provider Care Teams.  These Care Teams include your primary Cardiologist (physician) and Advanced Practice Providers (APPs -  Physician Assistants and Nurse Practitioners) who all work together to provide you with the care you need, when you need it.  We recommend signing up for the patient portal called "MyChart".  Sign up information is provided on this After Visit Summary.  MyChart is used to connect with patients for Virtual Visits (Telemedicine).  Patients are able to view lab/test results, encounter notes, upcoming appointments, etc.  Non-urgent messages can be sent to your provider as well.   To learn more about what you can do with MyChart, go to [email or web address].     Your next appointment:   12 months with Dr Graciela Husbands

## 2022-09-24 NOTE — Progress Notes (Signed)
Patient Care Team: Karle Plumber, MD as PCP - General (Internal Medicine) Duke Salvia, MD as PCP - Electrophysiology (Cardiology)   HPI  Janet Everett is a 87 y.o. female Janet Everett in followup for Afib and post termination pauses and sinus node dysfunction for which she underwent pacing Abbott 3/18 Anticoagulated with Xarelto no  bleeding  CAD with prior stenting x 2 done remotely, specific data not available  Complaints of dyspnea prompted reprogramming of max sensor URL from 120>>110; dyspnea improved.  PCP tried to put her on losartan.  Felt terrible.  Diaphoresis lightheadedness.  Reviewing the results of her imaging which demonstrated apical hypertrophy consistent with apical HCM.  Echo 3/18 have been read as normal, I gather that the apex is often hard to visualize on transthoracic imaging without contrast.  Family history is notable for her father dying when she was 14.    Date Cr Hgb LDL   3/18 0.98 13.6 162  40/10 0.79 13.2   10/20 1.05 13.7   3/22 1.10 13.0   5/24 1.17 13.8 86   DATE TEST EF   3/18 Echo   70-75 % Normal Wall thickness   5/24 CTA    % Apical HCM? aneurysm  6/24 Echo  65% Apical HCM                 Past Medical History:  Diagnosis Date   Atrial fibrillation and flutter (HCC)    Essential hypertension 04/01/2016   Lightheadedness/presyncope 04/01/2016    Past Surgical History:  Procedure Laterality Date   BREAST CYST ASPIRATION Right age 29   BREAST CYST EXCISION Right    age 29 and 2010   BREAST EXCISIONAL BIOPSY Right 2011   PACEMAKER IMPLANT N/A 03/31/2016   Procedure: Pacemaker Implant;  Surgeon: Duke Salvia, MD;  Location: [facility] INVASIVE CV LAB;  Service: Cardiovascular;  Laterality: N/A;   POCKET REVISION/RELOCATION N/A 04/02/2018   Procedure: POCKET REVISION/RELOCATION;  Surgeon: Duke Salvia, MD;  Location: [facility] INVASIVE CV LAB;  Service: Cardiovascular;  Laterality: N/A;    Current Outpatient Medications   Medication Sig Dispense Refill   acetaminophen (TYLENOL) 500 MG tablet Take 1,000 mg by mouth daily as needed for moderate pain or headache.     ALPRAZolam (XANAX) 0.5 MG tablet Take 0.5 mg by mouth daily as needed for anxiety.      Aromatic Inhalants (VICKS VAPOINHALER IN) Inhale 1 puff into the lungs daily as needed (congestion).     atorvastatin (LIPITOR) 40 MG tablet TAKE 1 TABLET(40 MG) BY MOUTH DAILY 90 tablet 3   cholecalciferol (VITAMIN D3) 25 MCG (1000 UT) tablet Take 1,000 Units by mouth 4 (four) times a week.     diltiazem (CARDIZEM CD) 240 MG 24 hr capsule Take 240 mg by mouth daily.     Ketotifen Fumarate (ALLERGY EYE DROPS OP) Place 1 drop into both eyes daily as needed (allergies).     magnesium hydroxide (MILK OF MAGNESIA) 400 MG/5ML suspension Take 30 mLs by mouth daily as needed for mild constipation.     metoprolol tartrate (LOPRESSOR) 100 MG tablet Take 1 tablet by mouth 2 hours prior to CT scan 1 tablet 0   Multiple Vitamin (MULTIVITAMIN WITH MINERALS) TABS tablet Take 1 tablet by mouth daily.     nitroGLYCERIN (NITROSTAT) 0.4 MG SL tablet Place 1 tablet (0.4 mg total) under the tongue every 5 (five) minutes as needed for chest pain. 25 tablet 3  Rivaroxaban (XARELTO) 15 MG TABS tablet Take 1 tablet (15 mg total) by mouth daily with supper. 30 tablet 5   spironolactone (ALDACTONE) 25 MG tablet Take 25 mg by mouth daily.     furosemide (LASIX) 20 MG tablet Take 1 tablet (20 mg total) by mouth as needed for fluid. (Patient not taking: Reported on 09/24/2022) 30 tablet 3   isosorbide mononitrate (IMDUR) 30 MG 24 hr tablet Take 1 tablet (30 mg total) by mouth daily. 90 tablet 3   No current facility-administered medications for this visit.    Allergies  Allergen Reactions   Losartan Nausea Only and Other (See Comments)    Nausea and sweating      Review of Systems negative except from HPI and PMH  Physical Exam BP 110/66   Pulse 66   Ht 5\' 3"  (1.6 m)   Wt 150 lb  (68 kg)   SpO2 98%   BMI 26.57 kg/m  Well developed and well nourished in no acute distress HENT normal Neck supple with JVP-flat Clear Device pocket well healed; without hematoma or erythema.  There is no tethering  Regular rate and rhythm, no   gallop 2/6 murmur Abd-soft with active BS No Clubbing cyanosis tr edema right lower extremity swelling up to her thigh greater than left Skin-warm and dry A & Oriented  Grossly normal sensory and motor function  ECG A pacing17/08/41 TWI V2-V6 1, L, 2  Device function is normal. Programming changes none  See Paceart for details     Assessment and  Plan Atrial fibrillation-paroxysmal  Sinus node dysfunction  Pacemaker St Jude  Hypertension  HCM-apical variant  Abnormal ECG  Coronary disease with prior stenting  Shortness of breath  Right lower extremity swelling  Hyperlipidemia   Atrial fibrillation-quiescient.  Continue Xarelto and diltiazem  Heart rate excursion relatively blunted compared to 5/24 with improved symptoms  Blood pressure well-controlled  Imaging suggest apical HCM.  Discussed the familial implications.  We will refer for genetic evaluation.  Her father died when she was 59, she has 2 sons.  ECG is consistent with this diagnosis  Dyspnea on exertion may well be related to HFpEF and is improved with a lower heart rate.  Lipids are better.  For right now we will hold   With her coronary disease, no indication for aspirin while she is on Xarelto.  Continue her statins as noted above        Current medicines are reviewed at length with the patient today .  The patient does not have concerns regarding medicines.

## 2022-09-25 ENCOUNTER — Ambulatory Visit (HOSPITAL_COMMUNITY)
Admission: RE | Admit: 2022-09-25 | Discharge: 2022-09-25 | Disposition: A | Discharge status: Home / Self Care | Payer: Medicare HMO | Source: Ambulatory Visit | Attending: Internal Medicine | Admitting: Internal Medicine

## 2022-09-25 DIAGNOSIS — R600 Localized edema: Secondary | ICD-10-CM | POA: Insufficient documentation

## 2022-10-29 ENCOUNTER — Ambulatory Visit (INDEPENDENT_AMBULATORY_CARE_PROVIDER_SITE_OTHER): Payer: Medicare HMO

## 2022-10-29 DIAGNOSIS — I495 Sick sinus syndrome: Secondary | ICD-10-CM | POA: Diagnosis not present

## 2022-10-29 LAB — CUP PACEART REMOTE DEVICE CHECK
Battery Remaining Longevity: 38 mo
Battery Remaining Percentage: 33 %
Battery Voltage: 2.95 V
Brady Statistic AP VP Percent: 1 %
Brady Statistic AP VS Percent: 96 %
Brady Statistic AS VP Percent: 1 %
Brady Statistic AS VS Percent: 3.7 %
Brady Statistic RA Percent Paced: 96 %
Brady Statistic RV Percent Paced: 1 %
Date Time Interrogation Session: 20241009040013
Implantable Lead Connection Status: 753985
Implantable Lead Connection Status: 753985
Implantable Lead Implant Date: 20180312
Implantable Lead Implant Date: 20180312
Implantable Lead Location: 753859
Implantable Lead Location: 753860
Implantable Pulse Generator Implant Date: 20180312
Lead Channel Impedance Value: 510 Ohm
Lead Channel Impedance Value: 650 Ohm
Lead Channel Pacing Threshold Amplitude: 0.75 V
Lead Channel Pacing Threshold Amplitude: 0.75 V
Lead Channel Pacing Threshold Pulse Width: 0.5 ms
Lead Channel Pacing Threshold Pulse Width: 0.5 ms
Lead Channel Sensing Intrinsic Amplitude: 12 mV
Lead Channel Sensing Intrinsic Amplitude: 5 mV
Lead Channel Setting Pacing Amplitude: 1 V
Lead Channel Setting Pacing Amplitude: 2 V
Lead Channel Setting Pacing Pulse Width: 0.5 ms
Lead Channel Setting Sensing Sensitivity: 2 mV
Pulse Gen Model: 2272
Pulse Gen Serial Number: 8005635

## 2022-11-18 NOTE — Progress Notes (Signed)
Remote pacemaker transmission.   

## 2022-12-02 ENCOUNTER — Ambulatory Visit: Payer: Medicare HMO | Attending: Genetic Counselor | Admitting: Genetic Counselor

## 2022-12-05 NOTE — Progress Notes (Signed)
Referring Provider: Sherryl Manges, MD  Pre Test Genetic Consult  Referral Reason  Janet Everett, is referred for genetic consult and testing of hypertrophic cardiomyopathy.  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 She underwent cardiac imaging studies at a regular follow up and was found to have apical hypertrophy.   She reports being mostly asymptomatic with occasional dyspnea, heart palpitation and dizziness. Denies having chest discomfort, fatigue or syncope.   Traditional Risk Factors Janet Everett reports having HTN at age 36 that is now well-controlled with medication but not so for the first 10 years.    Family history  Relation to Proband Pedigree # Current age Heart condition/age of onset Notes  Son, 1 IV.6 61 None Echo/EKG revealed some cardiac abnormality- unsure if HCM  Son, 2 IV.7 58 None Echo/EKG not done  Grandkids V.1-V.2 40s None         Brother III.1 Deceased None Died of sepsis @ 38  Sister III.3 Deceased None Died of brain tumor @ 71  Nephews, nieces IV.1-IV.5, IV.8-IV.10 60s-70s None IV.1-IV.2- Died of cancer and gunshot at ?        Father II.1 Deceased "enlarge heart"/HCM @ 58s Died @ 76s from heart issues  Paternal aunts, uncles ? Deceased Unaware Died @ ? of ?  Paternal grandfather I.1 Deceased unaware Died @ ? of ?   Paternal grandmother I.2 Deceased None Died @ 58- old age        Mother II.2 Deceased None Died of massive stroke @ 64  Maternal uncles, 2 II.3- II.4 Deceased None Died of TB and natural causes at ?  Maternal aunt II.5 Deceased None Died of breast cancer @ ?  Maternal grandfather I.3 Deceased Unaware Died @ ? of ?  Maternal grandmother I.4 Deceased None Died of HTN @ ?    Genetics Janet Everett was counseled on the genetics of hypertrophic cardiomyopathy (HCM). I explained to the patient that this is an autosomal dominant  condition with incomplete penetrance i.e. not all individuals harboring the HCM mutation will present clinically with HCM, and age-related penetrance where clinical presentation of HCM increases with advanced age. Variability in clinical expression is also seen in families with HCM with affected family members presenting clinically at different ages and with symptoms ranging from mild to severe.  Since HCM is an autosomal dominant condition, first degree-relatives should seek regular surveillance for HCM.  First-degree relatives, include her two sons. Clinical screening of first-degree relatives involves echocardiogram and EKG at regular intervals, frequency is typically determined by age, with those in their teens undergoing screening every year until the age of 20 and those over the age of 58 getting screened every 3-5 years. Patient verbalized understanding of this.  I informed the patient that about 8-10% of HCM patients can have compound and digenic mutations for HCM. Also briefly discussed the inheritance pattern and treatment /management plans for the infiltrative cardiomyopathies that present as HCM phenocopies.   Genetic testing is a probabilistic test dependent upon age and severity of presentation, presence of risk factors for HCM and importantly family history of HCM or sudden death in first-degree relatives.   If a mutation is not identified, then all first-degree relatives should undergo regular screening for HCM. I emphasized that even if the genetic test is negative, it does not mean that he does not have HCM. A negative test  result can be due to limitations of the genetic test.   There is also the likelihood of identifying a "Variant of unknown significance". This result means that the variant has not been detected in a statistically significant number of HCM patients and/or functional studies have not been performed to verify its pathogenicity. This VUS can be tested in the family to see  if it segregates with disease. If a VUS is found, first-degree relatives should undergo regular clinical screening for HCM.  If a pathogenic variant is reported, then first-degree family members can get tested for this variant. If they test positive, it is likely they will develop HCM. In light of variable expression and incomplete penetrance associated with HCM, it is not possible to predict when they will manifest clinically with HCM. It is recommended that family members that test positive for the familial pathogenic variant pursue clinical screening for HCM. Family members that test negative for the familial mutation need not pursue periodic screening for HCM, but seek care if symptoms develop.   Impression  Janet Everett was found to have cardiac wall thickness suggestive of HCM at age 60 in the absence of other loading conditions such as uncontrolled HTN that can cause cardiac hypertrophy. In addition, she reports HCM in her father making it highly likely that she inherited this condition from her father. HCM being an autosomal dominant condition, her children are at a 50% risk of inheriting it.   Genetic testing for HCM is recommended. This test should include the sarcomeric genes, namely MYBPC3, MYH7, TNNI3, TNNT2, TPM1, ACTC1, MYL2, MYL3 implicated in HCM as well as the genes for the HCM phenocopies, such as Fabry disease (GLA), Danon disease (LAMP2), WPW syndrome (PRKAG2) and familial transthyretin amyloidosis (TTR) and phospholamban (PLN)  In addition, we discussed the protections afforded by the Genetic Information Non-Discrimination Act (GINA). I explained to the patient that GINA protects them from losing their employment or health insurance based on their genotype. However, these protections do not cover life insurance and disability.  Please note that the patient has not been counseled in this visit on other personal, cultural, or ethical issues that they may face due to their heart condition.    Plan After a thorough discussion of the risk and benefits of genetic testing for HCM, Janet Everett declines HCM genetic testing due to insurance non-coverage.     Sidney Ace, Ph.D, [facility] Clinical Molecular Geneticist

## 2023-01-28 ENCOUNTER — Ambulatory Visit (INDEPENDENT_AMBULATORY_CARE_PROVIDER_SITE_OTHER): Payer: Medicare HMO

## 2023-01-28 DIAGNOSIS — I495 Sick sinus syndrome: Secondary | ICD-10-CM | POA: Diagnosis not present

## 2023-01-28 LAB — CUP PACEART REMOTE DEVICE CHECK
Battery Remaining Longevity: 36 mo
Battery Remaining Percentage: 30 %
Battery Voltage: 2.93 V
Brady Statistic AP VP Percent: 1 %
Brady Statistic AP VS Percent: 96 %
Brady Statistic AS VP Percent: 1 %
Brady Statistic AS VS Percent: 3.6 %
Brady Statistic RA Percent Paced: 96 %
Brady Statistic RV Percent Paced: 1 %
Date Time Interrogation Session: 20250108040014
Implantable Lead Connection Status: 753985
Implantable Lead Connection Status: 753985
Implantable Lead Implant Date: 20180312
Implantable Lead Implant Date: 20180312
Implantable Lead Location: 753859
Implantable Lead Location: 753860
Implantable Pulse Generator Implant Date: 20180312
Lead Channel Impedance Value: 510 Ohm
Lead Channel Impedance Value: 610 Ohm
Lead Channel Pacing Threshold Amplitude: 0.75 V
Lead Channel Pacing Threshold Amplitude: 0.75 V
Lead Channel Pacing Threshold Pulse Width: 0.5 ms
Lead Channel Pacing Threshold Pulse Width: 0.5 ms
Lead Channel Sensing Intrinsic Amplitude: 12 mV
Lead Channel Sensing Intrinsic Amplitude: 5 mV
Lead Channel Setting Pacing Amplitude: 1 V
Lead Channel Setting Pacing Amplitude: 2 V
Lead Channel Setting Pacing Pulse Width: 0.5 ms
Lead Channel Setting Sensing Sensitivity: 2 mV
Pulse Gen Model: 2272
Pulse Gen Serial Number: 8005635

## 2023-03-11 NOTE — Progress Notes (Signed)
 Remote pacemaker transmission.

## 2023-03-18 ENCOUNTER — Encounter: Payer: Self-pay | Admitting: Internal Medicine

## 2023-04-29 ENCOUNTER — Ambulatory Visit (INDEPENDENT_AMBULATORY_CARE_PROVIDER_SITE_OTHER): Payer: Medicare HMO

## 2023-04-29 DIAGNOSIS — I495 Sick sinus syndrome: Secondary | ICD-10-CM | POA: Diagnosis not present

## 2023-04-29 LAB — CUP PACEART REMOTE DEVICE CHECK
Battery Remaining Longevity: 32 mo
Battery Remaining Percentage: 27 %
Battery Voltage: 2.92 V
Brady Statistic AP VP Percent: 1 %
Brady Statistic AP VS Percent: 96 %
Brady Statistic AS VP Percent: 1 %
Brady Statistic AS VS Percent: 3.4 %
Brady Statistic RA Percent Paced: 96 %
Brady Statistic RV Percent Paced: 1 %
Date Time Interrogation Session: 20250409054020
Implantable Lead Connection Status: 753985
Implantable Lead Connection Status: 753985
Implantable Lead Implant Date: 20180312
Implantable Lead Implant Date: 20180312
Implantable Lead Location: 753859
Implantable Lead Location: 753860
Implantable Pulse Generator Implant Date: 20180312
Lead Channel Impedance Value: 490 Ohm
Lead Channel Impedance Value: 610 Ohm
Lead Channel Pacing Threshold Amplitude: 0.625 V
Lead Channel Pacing Threshold Amplitude: 0.875 V
Lead Channel Pacing Threshold Pulse Width: 0.5 ms
Lead Channel Pacing Threshold Pulse Width: 0.5 ms
Lead Channel Sensing Intrinsic Amplitude: 12 mV
Lead Channel Sensing Intrinsic Amplitude: 5 mV
Lead Channel Setting Pacing Amplitude: 1.125
Lead Channel Setting Pacing Amplitude: 2 V
Lead Channel Setting Pacing Pulse Width: 0.5 ms
Lead Channel Setting Sensing Sensitivity: 2 mV
Pulse Gen Model: 2272
Pulse Gen Serial Number: 8005635

## 2023-04-30 ENCOUNTER — Other Ambulatory Visit: Payer: Self-pay | Admitting: Internal Medicine

## 2023-05-02 ENCOUNTER — Encounter: Payer: Self-pay | Admitting: Internal Medicine

## 2023-06-12 NOTE — Addendum Note (Signed)
 Addended by: Lott Rouleau A on: 06/12/2023 11:08 AM   Modules accepted: Orders

## 2023-06-12 NOTE — Progress Notes (Signed)
 Remote pacemaker transmission.

## 2023-07-29 ENCOUNTER — Ambulatory Visit (INDEPENDENT_AMBULATORY_CARE_PROVIDER_SITE_OTHER): Payer: Medicare HMO

## 2023-07-29 DIAGNOSIS — I495 Sick sinus syndrome: Secondary | ICD-10-CM

## 2023-07-30 LAB — CUP PACEART REMOTE DEVICE CHECK
Battery Remaining Longevity: 29 mo
Battery Remaining Percentage: 25 %
Battery Voltage: 2.92 V
Brady Statistic AP VP Percent: 1 %
Brady Statistic AP VS Percent: 96 %
Brady Statistic AS VP Percent: 1 %
Brady Statistic AS VS Percent: 3.2 %
Brady Statistic RA Percent Paced: 97 %
Brady Statistic RV Percent Paced: 1 %
Date Time Interrogation Session: 20250709040017
Implantable Lead Connection Status: 753985
Implantable Lead Connection Status: 753985
Implantable Lead Implant Date: 20180312
Implantable Lead Implant Date: 20180312
Implantable Lead Location: 753859
Implantable Lead Location: 753860
Implantable Pulse Generator Implant Date: 20180312
Lead Channel Impedance Value: 480 Ohm
Lead Channel Impedance Value: 600 Ohm
Lead Channel Pacing Threshold Amplitude: 0.625 V
Lead Channel Pacing Threshold Amplitude: 0.875 V
Lead Channel Pacing Threshold Pulse Width: 0.5 ms
Lead Channel Pacing Threshold Pulse Width: 0.5 ms
Lead Channel Sensing Intrinsic Amplitude: 12 mV
Lead Channel Sensing Intrinsic Amplitude: 5 mV
Lead Channel Setting Pacing Amplitude: 1.125
Lead Channel Setting Pacing Amplitude: 2 V
Lead Channel Setting Pacing Pulse Width: 0.5 ms
Lead Channel Setting Sensing Sensitivity: 2 mV
Pulse Gen Model: 2272
Pulse Gen Serial Number: 8005635

## 2023-08-05 ENCOUNTER — Ambulatory Visit: Payer: Self-pay | Admitting: Cardiology

## 2023-09-21 NOTE — Progress Notes (Unsigned)
  Electrophysiology Office Note:   Date:  09/22/2023  ID:  Janet Everett, DOB 07-Oct-1935, MRN [id number]  Primary Cardiologist: None Primary Heart Failure: None Electrophysiologist: Janet Plaut Gladis Norton, MD      History of Present Illness:   Janet Everett is a 89 y.o. female with h/o apical variant hypertrophic cardiomyopathy, sick sinus syndrome, atrial fibrillation, coronary artery disease seen today for routine electrophysiology followup.   Since last being seen in our clinic the patient reports doing well.  She has no acute complaints at this time.  She is able to do all her daily activities without restriction.  She is happy with her control..  she denies chest pain, palpitations, dyspnea, PND, orthopnea, nausea, vomiting, dizziness, syncope, edema, weight gain, or early satiety.   Review of systems complete and found to be negative unless listed in HPI.      EP Information / Studies Reviewed:    EKG is ordered today. Personal review as below.  EKG Interpretation Date/Time:  Tuesday September 22 2023 10:47:02 EDT Ventricular Rate:  67 PR Interval:  156 QRS Duration:  74 QT Interval:  396 QTC Calculation: 418 R Axis:   31  Text Interpretation: Atrial-paced rhythm ST & T wave abnormality, consider anterolateral ischemia When compared with ECG of 24-Sep-2022 13:13, No significant change was found Confirmed by Janet Everett ([id number]) on 09/22/2023 10:52:22 AM   PPM Interrogation-  reviewed in detail today,  See PACEART report.  Device History: Abbott Dual Chamber PPM implanted 2018 for Sinus Node Dysfunction  Risk Assessment/Calculations:    CHA2DS2-VASc Score =     This indicates a  % annual risk of stroke. The patient's score is based upon:              Physical Exam:   VS:  BP 118/78 (BP Location: Right Arm, Patient Position: Sitting, Cuff Size: Large)   Pulse 67   Ht 5' 3 (1.6 m)   Wt 152 lb (68.9 kg)   SpO2 95%   BMI 26.93 kg/m    Wt Readings from Last 3  Encounters:  09/22/23 152 lb (68.9 kg)  09/24/22 150 lb (68 kg)  05/27/22 150 lb (68 kg)     GEN: Well nourished, well developed in no acute distress NECK: No JVD; No carotid bruits CARDIAC: Regular rate and rhythm, no murmurs, rubs, gallops RESPIRATORY:  Clear to auscultation without rales, wheezing or rhonchi  ABDOMEN: Soft, non-tender, non-distended EXTREMITIES:  No edema; No deformity   ASSESSMENT AND PLAN:    SND s/p Abbott PPM  Normal PPM function See Pace Art report No changes today  2.  Paroxysmal atrial fibrillation: On diltiazem .  Minimal episodes noted on interrogation.  Janet Everett continue with current management.  3.  Secondary hypercoagulable state: On Xarelto   4.  Coronary artery disease: Prior stenting.  No chest pain  5.  Apical variant hypertrophic cardiomyopathy: Has had genetic testing in the past.  Has 2 sons.  Father died at age 46.  Minimal symptoms  Disposition:   Follow up with EP APP in 12 months  Signed, Janet Huebert Gladis Norton, MD

## 2023-09-22 ENCOUNTER — Ambulatory Visit: Attending: Cardiology | Admitting: Cardiology

## 2023-09-22 ENCOUNTER — Encounter: Payer: Self-pay | Admitting: Cardiology

## 2023-09-22 VITALS — BP 118/78 | HR 67 | Ht 63.0 in | Wt 152.0 lb

## 2023-09-22 DIAGNOSIS — I480 Paroxysmal atrial fibrillation: Secondary | ICD-10-CM

## 2023-09-22 DIAGNOSIS — I2510 Atherosclerotic heart disease of native coronary artery without angina pectoris: Secondary | ICD-10-CM

## 2023-09-22 DIAGNOSIS — I495 Sick sinus syndrome: Secondary | ICD-10-CM | POA: Diagnosis not present

## 2023-09-22 DIAGNOSIS — I422 Other hypertrophic cardiomyopathy: Secondary | ICD-10-CM

## 2023-09-22 LAB — CUP PACEART INCLINIC DEVICE CHECK
Battery Remaining Longevity: 27 mo
Battery Voltage: 2.92 V
Brady Statistic RA Percent Paced: 97 %
Brady Statistic RV Percent Paced: 0.44 %
Date Time Interrogation Session: 20250902123538
Implantable Lead Connection Status: 753985
Implantable Lead Connection Status: 753985
Implantable Lead Implant Date: 20180312
Implantable Lead Implant Date: 20180312
Implantable Lead Location: 753859
Implantable Lead Location: 753860
Implantable Pulse Generator Implant Date: 20180312
Lead Channel Impedance Value: 512.5 Ohm
Lead Channel Impedance Value: 600 Ohm
Lead Channel Pacing Threshold Amplitude: 0.75 V
Lead Channel Pacing Threshold Amplitude: 0.75 V
Lead Channel Pacing Threshold Amplitude: 1 V
Lead Channel Pacing Threshold Amplitude: 1 V
Lead Channel Pacing Threshold Pulse Width: 0.5 ms
Lead Channel Pacing Threshold Pulse Width: 0.5 ms
Lead Channel Pacing Threshold Pulse Width: 0.5 ms
Lead Channel Pacing Threshold Pulse Width: 0.5 ms
Lead Channel Sensing Intrinsic Amplitude: 12 mV
Lead Channel Sensing Intrinsic Amplitude: 5 mV
Lead Channel Setting Pacing Amplitude: 1.125
Lead Channel Setting Pacing Amplitude: 2 V
Lead Channel Setting Pacing Pulse Width: 0.5 ms
Lead Channel Setting Sensing Sensitivity: 2 mV
Pulse Gen Model: 2272
Pulse Gen Serial Number: 8005635

## 2023-09-22 NOTE — Patient Instructions (Signed)
 Medication Instructions:  Your physician recommends that you continue on your current medications as directed. Please refer to the Current Medication list given to you today.  *If you need a refill on your cardiac medications before your next appointment, please call your pharmacy*  Lab Work: None ordered   Testing/Procedures: None ordered  Follow-Up: At [facility], you and your health needs are our priority.  As part of our continuing mission to provide you with exceptional heart care, our providers are all part of one team.  This team includes your primary Cardiologist (physician) and Advanced Practice Providers or APPs (Physician Assistants and Nurse Practitioners) who all work together to provide you with the care you need, when you need it.  Remote monitoring is used to monitor your Pacemaker or ICD from home. This monitoring reduces the number of office visits required to check your device to one time per year. It allows us  to keep an eye on the functioning of your device to ensure it is working properly. You are scheduled for a device check from home on 10/28/2023. You may send your transmission at any time that day. If you have a wireless device, the transmission will be sent automatically. After your physician reviews your transmission, you will receive a postcard with your next transmission date.   Your next appointment:   1 year(s)  Provider:   You may see Dr. Inocencio or one of the following Advanced Practice Providers on your designated Care Team:   Charlies Arthur, PA-C Michael Andy Tillery, PA-C Suzann Riddle, NP Daphne Barrack, NP     Thank you for choosing [facility]!!   Maeola Domino, RN [phone]

## 2023-09-24 ENCOUNTER — Ambulatory Visit: Payer: Self-pay | Admitting: Cardiology

## 2023-09-25 ENCOUNTER — Other Ambulatory Visit: Payer: Self-pay | Admitting: Internal Medicine

## 2023-10-28 ENCOUNTER — Ambulatory Visit (INDEPENDENT_AMBULATORY_CARE_PROVIDER_SITE_OTHER): Payer: Medicare HMO

## 2023-10-28 DIAGNOSIS — I495 Sick sinus syndrome: Secondary | ICD-10-CM

## 2023-10-29 LAB — CUP PACEART REMOTE DEVICE CHECK
Battery Remaining Longevity: 26 mo
Battery Remaining Percentage: 22 %
Battery Voltage: 2.9 V
Brady Statistic AP VP Percent: 1 %
Brady Statistic AP VS Percent: 97 %
Brady Statistic AS VP Percent: 1 %
Brady Statistic AS VS Percent: 2.8 %
Brady Statistic RA Percent Paced: 97 %
Brady Statistic RV Percent Paced: 1 %
Date Time Interrogation Session: 20251008040015
Implantable Lead Connection Status: 753985
Implantable Lead Connection Status: 753985
Implantable Lead Implant Date: 20180312
Implantable Lead Implant Date: 20180312
Implantable Lead Location: 753859
Implantable Lead Location: 753860
Implantable Pulse Generator Implant Date: 20180312
Lead Channel Impedance Value: 490 Ohm
Lead Channel Impedance Value: 580 Ohm
Lead Channel Pacing Threshold Amplitude: 0.625 V
Lead Channel Pacing Threshold Amplitude: 0.875 V
Lead Channel Pacing Threshold Pulse Width: 0.5 ms
Lead Channel Pacing Threshold Pulse Width: 0.5 ms
Lead Channel Sensing Intrinsic Amplitude: 12 mV
Lead Channel Sensing Intrinsic Amplitude: 2 mV
Lead Channel Setting Pacing Amplitude: 1.125
Lead Channel Setting Pacing Amplitude: 2 V
Lead Channel Setting Pacing Pulse Width: 0.5 ms
Lead Channel Setting Sensing Sensitivity: 2 mV
Pulse Gen Model: 2272
Pulse Gen Serial Number: 8005635

## 2023-10-30 ENCOUNTER — Ambulatory Visit: Payer: Self-pay | Admitting: Cardiology

## 2023-10-30 NOTE — Progress Notes (Signed)
 Remote PPM Transmission

## 2023-11-02 NOTE — Progress Notes (Signed)
 Remote PPM Transmission

## 2024-01-27 ENCOUNTER — Ambulatory Visit: Payer: Medicare HMO

## 2024-01-27 DIAGNOSIS — I495 Sick sinus syndrome: Secondary | ICD-10-CM | POA: Diagnosis not present

## 2024-01-28 ENCOUNTER — Ambulatory Visit: Payer: Self-pay | Admitting: Cardiology

## 2024-01-28 LAB — CUP PACEART REMOTE DEVICE CHECK
Battery Remaining Longevity: 23 mo
Battery Remaining Percentage: 19 %
Battery Voltage: 2.89 V
Brady Statistic AP VP Percent: 1 %
Brady Statistic AP VS Percent: 96 %
Brady Statistic AS VP Percent: 1 %
Brady Statistic AS VS Percent: 3.5 %
Brady Statistic RA Percent Paced: 96 %
Brady Statistic RV Percent Paced: 1 %
Date Time Interrogation Session: 20260107040013
Implantable Lead Connection Status: 753985
Implantable Lead Connection Status: 753985
Implantable Lead Implant Date: 20180312
Implantable Lead Implant Date: 20180312
Implantable Lead Location: 753859
Implantable Lead Location: 753860
Implantable Pulse Generator Implant Date: 20180312
Lead Channel Impedance Value: 490 Ohm
Lead Channel Impedance Value: 610 Ohm
Lead Channel Pacing Threshold Amplitude: 0.625 V
Lead Channel Pacing Threshold Amplitude: 0.75 V
Lead Channel Pacing Threshold Pulse Width: 0.5 ms
Lead Channel Pacing Threshold Pulse Width: 0.5 ms
Lead Channel Sensing Intrinsic Amplitude: 12 mV
Lead Channel Sensing Intrinsic Amplitude: 5 mV
Lead Channel Setting Pacing Amplitude: 1 V
Lead Channel Setting Pacing Amplitude: 2 V
Lead Channel Setting Pacing Pulse Width: 0.5 ms
Lead Channel Setting Sensing Sensitivity: 2 mV
Pulse Gen Model: 2272
Pulse Gen Serial Number: 8005635

## 2024-02-01 NOTE — Progress Notes (Signed)
 Remote PPM Transmission
